# Patient Record
Sex: Male | Born: 1945 | Race: White | Hispanic: No | Marital: Married | Smoking: Never smoker
Health system: Southern US, Community
[De-identification: ages and names within clinical notes are randomized; demographics above are authoritative.]

## PROBLEM LIST (undated history)

## (undated) DIAGNOSIS — I2510 Atherosclerotic heart disease of native coronary artery without angina pectoris: Secondary | ICD-10-CM

## (undated) DIAGNOSIS — K5730 Diverticulosis of large intestine without perforation or abscess without bleeding: Secondary | ICD-10-CM

## (undated) DIAGNOSIS — R739 Hyperglycemia, unspecified: Secondary | ICD-10-CM

## (undated) DIAGNOSIS — Z125 Encounter for screening for malignant neoplasm of prostate: Secondary | ICD-10-CM

## (undated) DIAGNOSIS — Z860100 Personal history of colon polyps, unspecified: Secondary | ICD-10-CM

## (undated) DIAGNOSIS — M542 Cervicalgia: Secondary | ICD-10-CM

## (undated) DIAGNOSIS — M47812 Spondylosis without myelopathy or radiculopathy, cervical region: Secondary | ICD-10-CM

## (undated) DIAGNOSIS — E785 Hyperlipidemia, unspecified: Secondary | ICD-10-CM

## (undated) DIAGNOSIS — R253 Fasciculation: Secondary | ICD-10-CM

## (undated) DIAGNOSIS — R011 Cardiac murmur, unspecified: Secondary | ICD-10-CM

## (undated) DIAGNOSIS — T8859XA Other complications of anesthesia, initial encounter: Secondary | ICD-10-CM

## (undated) DIAGNOSIS — E782 Mixed hyperlipidemia: Secondary | ICD-10-CM

## (undated) DIAGNOSIS — T4145XA Adverse effect of unspecified anesthetic, initial encounter: Secondary | ICD-10-CM

## (undated) DIAGNOSIS — Z87442 Personal history of urinary calculi: Secondary | ICD-10-CM

## (undated) DIAGNOSIS — H269 Unspecified cataract: Secondary | ICD-10-CM

## (undated) DIAGNOSIS — K219 Gastro-esophageal reflux disease without esophagitis: Secondary | ICD-10-CM

## (undated) DIAGNOSIS — R319 Hematuria, unspecified: Secondary | ICD-10-CM

## (undated) DIAGNOSIS — M5090 Cervical disc disorder, unspecified, unspecified cervical region: Secondary | ICD-10-CM

## (undated) DIAGNOSIS — Z79899 Other long term (current) drug therapy: Secondary | ICD-10-CM

## (undated) DIAGNOSIS — I10 Essential (primary) hypertension: Secondary | ICD-10-CM

## (undated) DIAGNOSIS — I209 Angina pectoris, unspecified: Secondary | ICD-10-CM

## (undated) HISTORY — DX: Encounter for screening for malignant neoplasm of prostate: Z12.5

## (undated) HISTORY — DX: Cervicalgia: M54.2

## (undated) HISTORY — DX: Mixed hyperlipidemia: E78.2

## (undated) HISTORY — DX: Diverticulosis of large intestine without perforation or abscess without bleeding: K57.30

## (undated) HISTORY — PX: CATARACT EXTRACTION, BILATERAL: SHX1313

## (undated) HISTORY — DX: Gastro-esophageal reflux disease without esophagitis: K21.9

## (undated) HISTORY — DX: Hyperglycemia, unspecified: R73.9

## (undated) HISTORY — DX: Essential (primary) hypertension: I10

## (undated) HISTORY — PX: VASECTOMY: SHX75

## (undated) HISTORY — DX: Unspecified cataract: H26.9

## (undated) HISTORY — DX: Hyperlipidemia, unspecified: E78.5

## (undated) HISTORY — PX: UMBILICAL HERNIA REPAIR: SUR1181

## (undated) HISTORY — DX: Atherosclerotic heart disease of native coronary artery without angina pectoris: I25.10

## (undated) HISTORY — PX: INGUINAL HERNIA REPAIR: SUR1180

## (undated) HISTORY — PX: CARDIAC CATHETERIZATION: SHX172

## (undated) HISTORY — DX: Cervical disc disorder, unspecified, unspecified cervical region: M50.90

## (undated) HISTORY — DX: Personal history of colonic polyps: Z86.010

## (undated) HISTORY — PX: COLONOSCOPY: SHX174

## (undated) HISTORY — DX: Other long term (current) drug therapy: Z79.899

## (undated) HISTORY — DX: Angina pectoris, unspecified: I20.9

## (undated) HISTORY — DX: Spondylosis without myelopathy or radiculopathy, cervical region: M47.812

## (undated) HISTORY — PX: APPENDECTOMY: SHX54

## (undated) HISTORY — DX: Personal history of colon polyps, unspecified: Z86.0100

---

## 2017-05-13 ENCOUNTER — Encounter: Payer: Self-pay | Admitting: Gastroenterology

## 2017-07-03 ENCOUNTER — Encounter (INDEPENDENT_AMBULATORY_CARE_PROVIDER_SITE_OTHER): Payer: Self-pay

## 2017-07-03 ENCOUNTER — Encounter: Payer: Self-pay | Admitting: Gastroenterology

## 2017-07-03 ENCOUNTER — Ambulatory Visit (INDEPENDENT_AMBULATORY_CARE_PROVIDER_SITE_OTHER): Payer: Medicare Other | Admitting: Gastroenterology

## 2017-07-03 VITALS — BP 100/70 | HR 76 | Ht 71.0 in | Wt 217.0 lb

## 2017-07-03 DIAGNOSIS — Z1211 Encounter for screening for malignant neoplasm of colon: Secondary | ICD-10-CM

## 2017-07-03 DIAGNOSIS — Z7902 Long term (current) use of antithrombotics/antiplatelets: Secondary | ICD-10-CM | POA: Diagnosis not present

## 2017-07-03 NOTE — Progress Notes (Signed)
HPI :  71 y/o male with a history of CAD, GERD, HLD, here for a new patient visit to discuss having a colonoscopy.   Records show he had a colonoscopy on 05/2012 showing extensive diverticular disease and no polyps per pictures and what the patient states, but the written report is not available, we don't know the quality of the bowel prep.  He is taking aspirin and plavix. Colonoscopy done in [facility] at a surgical center by a Dr. Vernon Prey.  He does have a history of polyps in 2003. No polyp on the last 2 exams he endorses over the past 10 years. He was told he needed a colonoscopy every 5 years.  No blood in the stools. No diarrhea or constipation. He has a BM once every other day, takes stool softeners to keep stools soft. Certain foods can precipitate some loose stools. He has hemorrhoids which bother his rarely, not bothering him routinely. He has never had diverticulitis.   No FH of colon cancer. History of angioplasty. He has some angina which bothers him rarely. No dyspnea. He takes nitroglycerin rarely. HE denies much heartbirnm at present, using zantac PRN which works well. No dysphagia.   No tobacco use, no alcohol.  Echocardiogram done 09/2015 - EF 65-70%   Past Medical History:  Diagnosis Date  . Angina, class II ([place])   . Arthritis of neck ([place])   . CAD (coronary artery disease)   . Cervical disc disease   . Diverticula of colon   . Dyslipidemia   . Encounter for long-term (current) use of medications   . GERD without esophagitis   . History of colon polyps   . HTN (hypertension)   . Hyperglycemia   . Hyperlipemia, mixed   . Neck pain   . Special screening for malignant neoplasm of prostate      Past Surgical History:  Procedure Laterality Date  . APPENDECTOMY    . CARDIAC CATHETERIZATION    . CATARACT EXTRACTION, BILATERAL    . COLONOSCOPY    . INGUINAL HERNIA REPAIR    . UMBILICAL HERNIA REPAIR    . VASECTOMY     Family History  Problem Relation Age of Onset    . Breast cancer Mother   . Heart disease Brother    Social History  Substance Use Topics  . Smoking status: Never Smoker  . Smokeless tobacco: Never Used  . Alcohol use No   Current Outpatient Prescriptions  Medication Sig Dispense Refill  . Ascorbic Acid (VITAMIN C) 500 MG CAPS Take by mouth.    Marland Kitchen aspirin 81 MG chewable tablet Chew by mouth daily.    . cetirizine (ZYRTEC) 10 MG tablet Take 10 mg by mouth daily.    . clopidogrel (PLAVIX) 75 MG tablet Take 75 mg by mouth daily.    . Coenzyme Q10 (COQ10) 200 MG CAPS Take 1 capsule by mouth daily.    Marland Kitchen docusate sodium (COLACE) 100 MG capsule Take 200 mg by mouth at bedtime.    . isosorbide mononitrate (IMDUR) 60 MG 24 hr tablet Take 60 mg by mouth every morning.    Marland Kitchen lisinopril (PRINIVIL,ZESTRIL) 10 MG tablet Take 10 mg by mouth at bedtime.    . metoprolol succinate (TOPROL-XL) 100 MG 24 hr tablet Take 100 mg by mouth daily. Take with or immediately following a meal.    . nitroGLYCERIN (NITROSTAT) 0.4 MG SL tablet Place 0.4 mg under the tongue every 5 (five) minutes as needed for chest pain.    Marland Kitchen  Omega-3 Fatty Acids (FISH OIL) 1200 MG CAPS Take 1 capsule by mouth daily.    . ranitidine (ZANTAC) 150 MG tablet Take 150 mg by mouth at bedtime as needed for heartburn.    . rosuvastatin (CRESTOR) 10 MG tablet Take 10 mg by mouth at bedtime.    . thiamine (VITAMIN B-1) 100 MG tablet Take 100 mg by mouth daily.    . vitamin B-12 (CYANOCOBALAMIN) 500 MCG tablet Take 500 mcg by mouth daily.     No current facility-administered medications for this visit.    No Known Allergies   Review of Systems: All systems reviewed and negative except where noted in HPI.    No results found.  Physical Exam: BP 100/70   Pulse 76   Ht 5\' 11"  (1.803 m)   Wt 217 lb (98.4 kg)   BMI 30.27 kg/m  Constitutional: Pleasant,well-developed, male in no acute distress. HEENT: Normocephalic and atraumatic. Conjunctivae are normal. No scleral icterus. Neck  supple.  Cardiovascular: Normal rate, regular rhythm.  Pulmonary/chest: Effort normal and breath sounds normal. No wheezing, rales or rhonchi. Abdominal: Soft, nondistended, nontender. There are no masses palpable.  Extremities: no edema Lymphadenopathy: No cervical adenopathy noted. Neurological: Alert and oriented to person place and time. Skin: Skin is warm and dry. No rashes noted. Psychiatric: Normal mood and affect. Behavior is normal.   ASSESSMENT AND PLAN: 70 year old male with medical history as outlined above, seen for new patient visit to discuss having a screening colonoscopy. He had one small polyp removed in his first colonoscopy several years ago, he reports his last 2 colonoscopies he has had no polyps removed. His last exam was done 5 years ago showing diverticulosis and no polyps. He has no family history of colon cancer, he has no alarm symptoms or concerning bowel changes.  I reviewed with him national guidelines for colon cancer screening. If the bowel preparation for his last exam was adequate, he is not due for another colonoscopy for 10 years from his last exam (2023). I will reach out to his primary care to obtain his colonoscopy report as we don't have that available today, to clarify the quality of his bowel preparation. He reports he was told he needed a colonoscopy every 5 years, this is a change for him from prior recommendations by other physicians. I discussed that screening guidelines can change over time. If he is anxious about this issue I offered him another colonoscopy now, he wants to think about it, and we'll await the report of his last exam to clarify the bowel preparation quality. He agreed with the plan. When he does have his next colonoscopy, plavix will need to be held for 5 days.  Schoenchen Cellar, MD [facility] Pager [phone]

## 2017-07-03 NOTE — Patient Instructions (Signed)
We will send for the report of your 2013 colonoscopy and contact you when we receive for further advisement. Please call us in a week if you have not heard from [place].  If you are age 71 or older, your body mass index should be between 23-30. Your Body mass index is 30.27 kg/m. If this is out of the aforementioned range listed, please consider follow up with your Primary Care Provider.  If you are age 90 or younger, your body mass index should be between 19-25. Your Body mass index is 30.27 kg/m. If this is out of the aformentioned range listed, please consider follow up with your Primary Care Provider.   Thank you.

## 2017-08-14 ENCOUNTER — Telehealth: Payer: Self-pay | Admitting: Gastroenterology

## 2017-08-14 NOTE — Telephone Encounter (Signed)
Records obtained for prior colonoscopy, done at [facility] in [facility] by Dr. Mariana Arn  Colonoscopy 06/13/12 - extensive diverticulosis, normal colon otherwise no polyps - GOOD bowel preparation   Jan can you please let this patient know based on guidelines he does not warrant another screening colonoscopy until 05/2022. I know this is different from what his prior GI physician had told him, but he has not had a polyp on his last 2 colonoscopies. If he is anxious about this and wants it sooner, I offered him the exam per our last clinic visit for piece of mind, but otherwise I think okay to wait until 05/2022

## 2017-08-14 NOTE — Telephone Encounter (Signed)
Spoke to pt. Relayed findings and recommendations.  He may want to do colon sooner.  Very appreciative and complimentary of our practice.  Placed recall for 05-2022 according to Dr. Doyne Keel recommendations.

## 2018-01-14 ENCOUNTER — Other Ambulatory Visit: Payer: Self-pay | Admitting: Urology

## 2018-01-23 ENCOUNTER — Encounter (HOSPITAL_COMMUNITY): Payer: Self-pay

## 2018-01-23 NOTE — Patient Instructions (Addendum)
Your procedure is scheduled on: Thursday, January 30, 2018   Surgery Time:  4:15PM 4:45PM   Report to [facility]  Entrance    Report to admitting at 2:15 PM   Call this number if you have problems the morning of surgery [phone]   Do not eat food:After Midnight.   May have liquids until 8:00AM day of surgery      CLEAR LIQUID DIET   Foods Allowed                                                                     Foods Excluded  Coffee and tea, regular and decaf                             liquids that you cannot  Plain Jell-O in any flavor                                             see through such as: Fruit ices (not with fruit pulp)                                     milk, soups, orange juice  Iced Popsicles                                    All solid food Carbonated beverages, regular and diet                                    Cranberry, grape and apple juices Sports drinks like Gatorade Lightly seasoned clear broth or consume(fat free) Sugar, honey syrup  Sample Menu Breakfast                                Lunch                                     Supper Cranberry juice                    Beef broth                            Chicken broth Jell-O                                     Grape juice                           Apple juice Coffee or tea  Jell-O                                      Popsicle                                                Coffee or tea                        Coffee or tea   Do NOT smoke after Midnight   Take these medicines the morning of surgery with A SIP OF WATER: Cetirizine, Isosorbide, Metoprolol                               You may not have any metal on your body including jewelry, and body piercings             Do not wear lotions, powders, perfumes/cologne, or deodorant                          Men may shave face and neck.   Do not bring valuables to the hospital. [place].   Contacts, dentures or bridgework may not be worn into surgery.   Patients discharged the day of surgery will not be allowed to drive home.   Name and phone number of your driver:  Katharine Look (wife) [phone]   Special Instructions: Bring a copy of your healthcare power of attorney and living will documents         the day of surgery if you haven't scanned them in before.              Please read over the following fact sheets you were given:  [facility] - Preparing for Surgery Before surgery, you can play an important role.  Because skin is not sterile, your skin needs to be as free of germs as possible.  You can reduce the number of germs on your skin by washing with CHG (chlorahexidine gluconate) soap before surgery.  CHG is an antiseptic cleaner which kills germs and bonds with the skin to continue killing germs even after washing. Please DO NOT use if you have an allergy to CHG or antibacterial soaps.  If your skin becomes reddened/irritated stop using the CHG and inform your nurse when you arrive at Short Stay. Do not shave (including legs and underarms) for at least 48 hours prior to the first CHG shower.  You may shave your face/neck.  Please follow these instructions carefully:  1.  Shower with CHG Soap the night before surgery and the  morning of surgery.  2.  If you choose to wash your hair, wash your hair first as usual with your normal  shampoo.  3.  After you shampoo, rinse your hair and body thoroughly to remove the shampoo.                             4.  Use CHG as you would any other liquid soap.  You can apply chg directly to  the skin and wash.  Gently with a scrungie or clean washcloth.  5.  Apply the CHG Soap to your body ONLY FROM THE NECK DOWN.   Do   not use on face/ open                           Wound or open sores. Avoid contact with eyes, ears mouth and   genitals (private parts).                       Wash face,  Genitals  (private parts) with your normal soap.             6.  Wash thoroughly, paying special attention to the area where your    surgery  will be performed.  7.  Thoroughly rinse your body with warm water from the neck down.  8.  DO NOT shower/wash with your normal soap after using and rinsing off the CHG Soap.                9.  Pat yourself dry with a clean towel.            10.  Wear clean pajamas.            11.  Place clean sheets on your bed the night of your first shower and do not  sleep with pets. Day of Surgery : Do not apply any lotions/deodorants the morning of surgery.  Please wear clean clothes to the hospital/surgery center.  FAILURE TO FOLLOW THESE INSTRUCTIONS MAY RESULT IN THE CANCELLATION OF YOUR SURGERY  PATIENT SIGNATURE_________________________________  NURSE SIGNATURE__________________________________  ________________________________________________________________________

## 2018-01-24 ENCOUNTER — Encounter (HOSPITAL_COMMUNITY)
Admission: RE | Admit: 2018-01-24 | Discharge: 2018-01-24 | Disposition: A | Discharge status: Home / Self Care | Payer: Medicare Other | Source: Ambulatory Visit | Attending: Urology | Admitting: Urology

## 2018-01-24 ENCOUNTER — Encounter (HOSPITAL_COMMUNITY): Payer: Self-pay

## 2018-01-24 ENCOUNTER — Other Ambulatory Visit: Payer: Self-pay

## 2018-01-24 DIAGNOSIS — R9431 Abnormal electrocardiogram [ECG] [EKG]: Secondary | ICD-10-CM | POA: Diagnosis not present

## 2018-01-24 DIAGNOSIS — Z01812 Encounter for preprocedural laboratory examination: Secondary | ICD-10-CM | POA: Insufficient documentation

## 2018-01-24 DIAGNOSIS — N201 Calculus of ureter: Secondary | ICD-10-CM | POA: Insufficient documentation

## 2018-01-24 DIAGNOSIS — Z01810 Encounter for preprocedural cardiovascular examination: Secondary | ICD-10-CM | POA: Insufficient documentation

## 2018-01-24 HISTORY — DX: Fasciculation: R25.3

## 2018-01-24 HISTORY — DX: Other complications of anesthesia, initial encounter: T88.59XA

## 2018-01-24 HISTORY — DX: Hematuria, unspecified: R31.9

## 2018-01-24 HISTORY — DX: Cardiac murmur, unspecified: R01.1

## 2018-01-24 HISTORY — DX: Personal history of urinary calculi: Z87.442

## 2018-01-24 HISTORY — DX: Adverse effect of unspecified anesthetic, initial encounter: T41.45XA

## 2018-01-24 LAB — BASIC METABOLIC PANEL
ANION GAP: 5 (ref 5–15)
BUN: 20 mg/dL (ref 6–20)
CALCIUM: 9.3 mg/dL (ref 8.9–10.3)
CO2: 26 mmol/L (ref 22–32)
Chloride: 107 mmol/L (ref 101–111)
Creatinine, Ser: 0.78 mg/dL (ref 0.61–1.24)
GFR calc Af Amer: >60 mL/min (ref >60)
GFR calc non Af Amer: >60 mL/min (ref >60)
GLUCOSE: 99 mg/dL (ref 65–99)
Potassium: 4 mmol/L (ref 3.5–5.1)
Sodium: 138 mmol/L (ref 135–145)

## 2018-01-24 LAB — CBC
HEMATOCRIT: 44.6 % (ref 39.0–52.0)
HEMOGLOBIN: 15.1 g/dL (ref 13.0–17.0)
MCH: 31.1 pg (ref 26.0–34.0)
MCHC: 33.9 g/dL (ref 30.0–36.0)
MCV: 91.8 fL (ref 78.0–100.0)
Platelets: 162 10*3/uL (ref 150–400)
RBC: 4.86 MIL/uL (ref 4.22–5.81)
RDW: 12.8 % (ref 11.5–15.5)
WBC: 5.7 10*3/uL (ref 4.0–10.5)

## 2018-01-27 NOTE — Pre-Procedure Instructions (Signed)
Dr. Hatchett reviewed EKG no new orders received at this time. 

## 2018-01-29 NOTE — H&P (Signed)
Office Visit Report     01/03/2018     --------------------------------------------------------------------------------     Henry Morris   MRN: [id number]  PRIMARY CARE:     DOB: Sep 29, 1946, 72 year old Male  REFERRING:  Henry Morris   SSN: -**[id number]  PROVIDER:  Raynelle Bring, M.D.     LOCATION:  [facility] [phone]     --------------------------------------------------------------------------------     CC/HPI: Hematuria     Henry Morris returns today to complete his hematuria evaluation. He has denied any gross hematuria or any pain symptoms since his last visit. He follows up today to undergo CT imaging and cystoscopy.        ALLERGIES: None     MEDICATIONS: Aspirin   Lisinopril   Metoprolol Succinate   Cetirizine Hcl   Clopidogrel   Coq10   Docusate Sodium   Isosorbide   Ranitidine Hcl   Rosuvastatin Calcium   Vitamin C 500 mg tablet        GU PSH: Vasectomy       NON-GU PSH: Anesth, Catheterize Heart  Appendectomy (open)  Cataract surgery, Bilateral  Hernia Repair       GU PMH: Gross hematuria - 12/13/2017         PMH Notes:     1) Hematuria: He developed gross, painless hematuria in June 2018. He since has had multiple urinalyses demonstrating microscopic hematuria. He does take Plavix for CAD prevention. He denies tobacco use and has no family history of GU malignancy.      NON-GU PMH: Arthritis  GERD  Hypercholesterolemia  Hypertension       FAMILY HISTORY: 1 Daughter - Runs in Family  Breast Cancer - Mother  Myocardial Infarction - Mother     Notes: Father decreased from drowning     SOCIAL HISTORY: Marital Status: Married  Preferred Language: English; Ethnicity: Not Hispanic Or Latino; Race: White  Current Smoking Status: Patient has never smoked.     Tobacco Use Assessment Completed: Used Tobacco in last 30 days?  Does not drink anymore.   Drinks 1 caffeinated drink per day.       REVIEW OF SYSTEMS:      GU Review Male:   Patient denies frequent urination, hard to postpone urination, burning/ pain with urination, get up at night to urinate, leakage of urine, stream starts and stops, trouble starting your streams, and have to strain to urinate .   Gastrointestinal (Lower):   Patient denies diarrhea and constipation.   Gastrointestinal (Upper):   Patient denies nausea and vomiting.   Constitutional:   Patient denies fever, night sweats, weight loss, and fatigue.   Skin:   Patient denies skin rash/ lesion and itching.   Eyes:   Patient denies blurred vision and double vision.   Ears/ Nose/ Throat:   Patient denies sore throat and sinus problems.   Hematologic/Lymphatic:   Patient denies swollen glands and easy bruising.   Cardiovascular:   Patient denies leg swelling and chest pains.   Respiratory:   Patient denies cough and shortness of breath.   Endocrine:   Patient denies excessive thirst.   Musculoskeletal:   Patient denies back pain and joint pain.   Neurological:   Patient denies headaches and dizziness.   Psychologic:   Patient denies depression and anxiety.     VITAL SIGNS:       01/03/2018 02:38 PM   Weight 215 lb / 97.52 kg  Height 70 in / 177.8 cm   BP 127/78 mmHg   Heart Rate 76 /min   Temperature 98.2 F / 36.7 C   BMI 30.8 kg/m     GU PHYSICAL EXAMINATION:     Urethral Meatus: Normal size. No lesion, no wart, no discharge, no polyp. Normal location.     MULTI-SYSTEM PHYSICAL EXAMINATION:     Constitutional: Well-nourished. No physical deformities. Normally developed. Good grooming.   Gastrointestinal: No mass, no tenderness, no rigidity, non obese abdomen. No CVA tenderness.        PAST DATA REVIEWED:   Source Of History:  Patient   Urine Test Review:   Urinalysis   X-Ray Review: C.T. Abdomen/Pelvis: Reviewed Films.      Notes:                     Creatinine 0.7     I independently reviewed his CT scan. He does have multiple small nonobstructing right renal  calculi. There are noted to be large bilateral cysts without concern for malignancy. On the left side, he does have a distal 3-4 mm left UVJ calculus. No obvious bladder lesions.     PROCEDURES:          Flexible Cystoscopy - 52000   Indication: Hematuria  Risks, benefits, and potential complications of the procedure were discussed with the patient including infection, bleeding, voiding discomfort, urinary retention, fever, chills, sepsis, and others. All questions were answered. Informed consent was obtained. Sterile technique and intraurethral analgesia were used.   Meatus:  Normal size. Normal location. Normal condition.   Urethra:  No strictures.   External Sphincter:  Normal.   Verumontanum:  Normal.   Prostate:  Non-obstructing. No hyperplasia.   Bladder Neck:  Non-obstructing.   Ureteral Orifices:  Normal location. Normal size. Normal shape. Effluxed clear urine. There is some edema and noted at the left ureteral orifice consistent with his UVJ calculus.   Bladder:  No trabeculation. No tumors. Normal mucosa. No stones.         Chaperone: Eddie North  The procedure was well-tolerated and without complications. Instructions were given to call the office immediately if questions or problems.            C.T. Hematuria - 74178, [id number]         [address]              Urinalysis  Dipstick Dipstick Cont'd   Color: Yellow Bilirubin: Neg   Appearance: Clear Ketones: Neg   Specific Gravity: 1.025 Blood: Neg   pH: 6.0 Protein: Neg   Glucose: Neg Urobilinogen: 0.2     Nitrites: Neg     Leukocyte Esterase: Neg       ASSESSMENT:       ICD-10 Details   1 GU:   Ureteral calculus - N20.1    2   Gross hematuria - R31.0      PLAN:               Medications  New Meds: Hydrocodone-Acetaminophen 5 mg-325 mg tablet 1-2 tablet PO Q 6 H prn   #20  0 Refill(s)   Tamsulosin Hcl 0.4 mg capsule 1 capsule PO Q HS   #14  0 Refill(s)                Schedule  Return Visit/Planned  Activity: Other See Visit Notes              Note:  Will call to schedule surgery             Document  Letter(s):  Created for Patient: Clinical Summary            Notes:   1. Hematuria: We discussed potential causes for his hematuria which may include his stone disease most likely but also probably his BPH. However, I did reassure him that there are no concerning areas to suggest a malignant etiology.     2. Left ureteral calculus: Fortunately, he has not been symptomatic but I did inform him about his ureteral stone today. We reviewed options and will proceed with an initial course of medical expulsion therapy. He was provided a prescription for hydrocodone and tamsulosin today. He was also instructed on appropriate precautions and will call should he develop fever, uncontrolled pain, or uncontrolled nausea/vomiting.     It is difficult to know how long his stone has been present as he has been asymptomatic. We have agreed to tentatively plan for definitive treatment over the next few weeks unless he passes the stone. After reviewing options and considering his anti-platelet agent, we have discussed proceeding with left ureteroscopic stone removal with possible laser lithotripsy and possible stent placement. We reviewed the potential risks and complications of this procedure. He is informed consent. He will notify me if he passes the stone prior to his planned procedure.     Cc: Dr. Eber Hong        * Signed by Raynelle Bring, M.D. on 01/03/18 at 8:07 PM (EDT)*

## 2018-01-30 ENCOUNTER — Ambulatory Visit (HOSPITAL_COMMUNITY)
Admission: RE | Admit: 2018-01-30 | Discharge: 2018-01-30 | Disposition: A | Discharge status: Home / Self Care | Payer: Medicare Other | Source: Ambulatory Visit | Attending: Urology | Admitting: Urology

## 2018-01-30 ENCOUNTER — Ambulatory Visit (HOSPITAL_COMMUNITY): Payer: Medicare Other | Admitting: Certified Registered Nurse Anesthetist

## 2018-01-30 ENCOUNTER — Encounter (HOSPITAL_COMMUNITY): Payer: Self-pay

## 2018-01-30 ENCOUNTER — Telehealth (HOSPITAL_COMMUNITY): Payer: Self-pay | Admitting: *Deleted

## 2018-01-30 ENCOUNTER — Ambulatory Visit (HOSPITAL_COMMUNITY): Payer: Medicare Other

## 2018-01-30 ENCOUNTER — Encounter (HOSPITAL_COMMUNITY)
Admission: RE | Disposition: A | Discharge status: Home / Self Care | Payer: Self-pay | Source: Ambulatory Visit | Attending: Urology

## 2018-01-30 DIAGNOSIS — Z7902 Long term (current) use of antithrombotics/antiplatelets: Secondary | ICD-10-CM | POA: Insufficient documentation

## 2018-01-30 DIAGNOSIS — E785 Hyperlipidemia, unspecified: Secondary | ICD-10-CM | POA: Diagnosis not present

## 2018-01-30 DIAGNOSIS — N201 Calculus of ureter: Secondary | ICD-10-CM | POA: Insufficient documentation

## 2018-01-30 DIAGNOSIS — Z7982 Long term (current) use of aspirin: Secondary | ICD-10-CM | POA: Diagnosis not present

## 2018-01-30 DIAGNOSIS — I10 Essential (primary) hypertension: Secondary | ICD-10-CM | POA: Insufficient documentation

## 2018-01-30 DIAGNOSIS — K219 Gastro-esophageal reflux disease without esophagitis: Secondary | ICD-10-CM | POA: Diagnosis not present

## 2018-01-30 DIAGNOSIS — Z79899 Other long term (current) drug therapy: Secondary | ICD-10-CM | POA: Diagnosis not present

## 2018-01-30 HISTORY — PX: CYSTOSCOPY/URETEROSCOPY/HOLMIUM LASER/STENT PLACEMENT: SHX6546

## 2018-01-30 SURGERY — CYSTOSCOPY/URETEROSCOPY/HOLMIUM LASER/STENT PLACEMENT
Anesthesia: General | Site: Ureter | Laterality: Left

## 2018-01-30 MED ORDER — HYDROCODONE-ACETAMINOPHEN 5-325 MG PO TABS: 1.0000 | ORAL_TABLET | Freq: Four times a day (QID) | ORAL | 0 refills | Status: DC | PRN

## 2018-01-30 MED ORDER — SODIUM CHLORIDE 0.9 % IR SOLN
Status: DC | PRN
  Administered 2018-01-30: 3000 mL

## 2018-01-30 MED ORDER — ONDANSETRON HCL 4 MG/2ML IJ SOLN
INTRAMUSCULAR | Status: AC
  Filled 2018-01-30: qty 2

## 2018-01-30 MED ORDER — HYDROMORPHONE HCL 1 MG/ML IJ SOLN: 0.2500 mg | INTRAMUSCULAR | Status: DC | PRN

## 2018-01-30 MED ORDER — CEFAZOLIN SODIUM-DEXTROSE 2-4 GM/100ML-% IV SOLN
2.0000 g | Freq: Once | INTRAVENOUS | Status: AC
  Administered 2018-01-30: 2 g via INTRAVENOUS
  Filled 2018-01-30: qty 100

## 2018-01-30 MED ORDER — PROPOFOL 10 MG/ML IV BOLUS
INTRAVENOUS | Status: DC | PRN
  Administered 2018-01-30: 130 mg via INTRAVENOUS
  Administered 2018-01-30: 70 mg via INTRAVENOUS

## 2018-01-30 MED ORDER — LIDOCAINE 2% (20 MG/ML) 5 ML SYRINGE
INTRAMUSCULAR | Status: DC | PRN
  Administered 2018-01-30: 80 mg via INTRAVENOUS

## 2018-01-30 MED ORDER — FENTANYL CITRATE (PF) 100 MCG/2ML IJ SOLN
INTRAMUSCULAR | Status: DC | PRN
  Administered 2018-01-30 (×2): 50 ug via INTRAVENOUS

## 2018-01-30 MED ORDER — HYDROCODONE-ACETAMINOPHEN 7.5-325 MG PO TABS: 1.0000 | ORAL_TABLET | Freq: Once | ORAL | Status: DC | PRN

## 2018-01-30 MED ORDER — LIDOCAINE 2% (20 MG/ML) 5 ML SYRINGE
INTRAMUSCULAR | Status: AC
  Filled 2018-01-30: qty 5

## 2018-01-30 MED ORDER — ONDANSETRON HCL 4 MG/2ML IJ SOLN: 4.0000 mg | Freq: Once | INTRAMUSCULAR | Status: DC | PRN

## 2018-01-30 MED ORDER — ONDANSETRON HCL 4 MG/2ML IJ SOLN
INTRAMUSCULAR | Status: DC | PRN
  Administered 2018-01-30: 4 mg via INTRAVENOUS

## 2018-01-30 MED ORDER — MEPERIDINE HCL 50 MG/ML IJ SOLN: 6.2500 mg | INTRAMUSCULAR | Status: DC | PRN

## 2018-01-30 MED ORDER — DEXAMETHASONE SODIUM PHOSPHATE 10 MG/ML IJ SOLN
INTRAMUSCULAR | Status: AC
  Filled 2018-01-30: qty 1

## 2018-01-30 MED ORDER — PROPOFOL 10 MG/ML IV BOLUS
INTRAVENOUS | Status: AC
  Filled 2018-01-30: qty 20

## 2018-01-30 MED ORDER — DEXAMETHASONE SODIUM PHOSPHATE 10 MG/ML IJ SOLN
INTRAMUSCULAR | Status: DC | PRN
  Administered 2018-01-30: 10 mg via INTRAVENOUS

## 2018-01-30 MED ORDER — FENTANYL CITRATE (PF) 100 MCG/2ML IJ SOLN
INTRAMUSCULAR | Status: AC
  Filled 2018-01-30: qty 2

## 2018-01-30 MED ORDER — LACTATED RINGERS IV SOLN
INTRAVENOUS | Status: DC
  Administered 2018-01-30: 14:00:00 via INTRAVENOUS

## 2018-01-30 SURGICAL SUPPLY — 20 items
BAG URO CATCHER STRL LF (MISCELLANEOUS) ×3 IMPLANT
BASKET ZERO TIP NITINOL 2.4FR (BASKET) IMPLANT
CATH INTERMIT  6FR 70CM (CATHETERS) IMPLANT
CLOTH BEACON ORANGE TIMEOUT ST (SAFETY) ×3 IMPLANT
COVER FOOTSWITCH UNIV (MISCELLANEOUS) IMPLANT
COVER SURGICAL LIGHT HANDLE (MISCELLANEOUS) ×3 IMPLANT
FIBER LASER FLEXIVA 365 (UROLOGICAL SUPPLIES) ×3 IMPLANT
FIBER LASER TRAC TIP (UROLOGICAL SUPPLIES) IMPLANT
GLOVE BIOGEL M STRL SZ7.5 (GLOVE) ×3 IMPLANT
GOWN STRL REUS W/TWL LRG LVL3 (GOWN DISPOSABLE) ×6 IMPLANT
GUIDEWIRE ANG ZIPWIRE 038X150 (WIRE) IMPLANT
GUIDEWIRE STR DUAL SENSOR (WIRE) ×3 IMPLANT
IV NS 1000ML (IV SOLUTION) ×2
IV NS 1000ML BAXH (IV SOLUTION) ×1 IMPLANT
MANIFOLD NEPTUNE II (INSTRUMENTS) ×3 IMPLANT
PACK CYSTO (CUSTOM PROCEDURE TRAY) ×3 IMPLANT
SHEATH URETERAL 12FRX35CM (MISCELLANEOUS) IMPLANT
TUBING CONNECTING 10 (TUBING) ×2 IMPLANT
TUBING CONNECTING 10' (TUBING) ×1
TUBING UROLOGY SET (TUBING) ×3 IMPLANT

## 2018-01-30 NOTE — Interval H&P Note (Signed)
History and Physical Interval Note:  01/30/2018 2:22 PM  Henry Morris  has presented today for surgery, with the diagnosis of LEFT URETERAL STONE  The various methods of treatment have been discussed with the patient and family. After consideration of risks, benefits and other options for treatment, the patient has consented to  Procedure(s) with comments: CYSTOSCOPY/URETEROSCOPY/HOLMIUM LASER/STENT PLACEMENT (Left) - ONLY NEEDS 30 MIN FOR TOTAL PROCEDURE as a surgical intervention .  The patient's history has been reviewed, patient examined, no change in status, stable for surgery.  I have reviewed the patient's chart and labs.  Questions were answered to the patient's satisfaction.     Dereke Neumann,LES

## 2018-01-30 NOTE — Anesthesia Postprocedure Evaluation (Signed)
Anesthesia Post Note  Patient: Henry Morris  Procedure(s) Performed: CYSTOSCOPY/URETEROSCOPY/HOLMIUM LASER/STONE EXTRACTION (Left Ureter)     Patient location during evaluation: PACU Anesthesia Type: General Level of consciousness: awake and alert and oriented Pain management: pain level controlled Vital Signs Assessment: post-procedure vital signs reviewed and stable Respiratory status: spontaneous breathing, nonlabored ventilation and respiratory function stable Cardiovascular status: blood pressure returned to baseline and stable Postop Assessment: no apparent nausea or vomiting Anesthetic complications: no    Last Vitals:  Vitals:   01/30/18 1615 01/30/18 1630  BP: 120/80 125/81  Pulse: 73 73  Resp: 13 16  Temp:  36.9 C  SpO2: 92% 95%    Last Pain:  Vitals:   01/30/18 1630  TempSrc:   PainSc: 0-No pain                 Tayjon Halladay A.

## 2018-01-30 NOTE — Anesthesia Preprocedure Evaluation (Addendum)
Anesthesia Evaluation  Patient identified by MRN, date of birth, ID band Patient awake    Reviewed: Allergy & Precautions, NPO status , Patient's Chart, lab work & pertinent test results, reviewed documented beta blocker date and time   History of Anesthesia Complications (+) history of anesthetic complications  Airway Mallampati: II  TM Distance: >3 FB Neck ROM: Full    Dental no notable dental hx. (+) Teeth Intact, Caps   Pulmonary neg pulmonary ROS,    Pulmonary exam normal breath sounds clear to auscultation       Cardiovascular Exercise Tolerance: Good hypertension, Pt. on medications and Pt. on home beta blockers + angina with exertion and at rest + CAD  Normal cardiovascular exam+ Valvular Problems/Murmurs MVP  Rhythm:Regular Rate:Normal  Angioplasty 10 years ago Occasional angina  EKG 01/24/2018- NSR 1st degree AV Block, RBBB pattern, Old inferior wall MI, Poor R wave progression in V leads possible anterior wall MI age indeterminate   Neuro/Psych negative neurological ROS  negative psych ROS   GI/Hepatic Neg liver ROS, GERD  Medicated and Controlled,  Endo/Other  Hyperlipidemia  Renal/GU Left ureteral calculus  negative genitourinary   Musculoskeletal  (+) Arthritis , Osteoarthritis,    Abdominal   Peds  Hematology Plavix- last dose yesterday   Anesthesia Other Findings   Reproductive/Obstetrics                           Anesthesia Physical Anesthesia Plan  ASA: III  Anesthesia Plan: General   Post-op Pain Management:    Induction: Intravenous  PONV Risk Score and Plan: Ondansetron, Midazolam, Treatment may vary due to age or medical condition and Dexamethasone  Airway Management Planned: LMA  Additional Equipment:   Intra-op Plan:   Post-operative Plan: Extubation in OR  Informed Consent: I have reviewed the patients History and Physical, chart, labs and discussed  the procedure including the risks, benefits and alternatives for the proposed anesthesia with the patient or authorized representative who has indicated his/her understanding and acceptance.   Dental advisory given  Plan Discussed with: CRNA, Anesthesiologist and Surgeon  Anesthesia Plan Comments:         Anesthesia Quick Evaluation

## 2018-01-30 NOTE — Transfer of Care (Signed)
Immediate Anesthesia Transfer of Care Note  Patient: Henry Morris  Procedure(s) Performed: CYSTOSCOPY/URETEROSCOPY/HOLMIUM LASER/STONE EXTRACTION (Left Ureter)  Patient Location: PACU  Anesthesia Type:General  Level of Consciousness: awake, alert  and patient cooperative  Airway & Oxygen Therapy: Patient Spontanous Breathing and Patient connected to nasal cannula oxygen  Post-op Assessment: Report given to RN and Post -op Vital signs reviewed and stable  Post vital signs: Reviewed and stable  Last Vitals:  Vitals Value Taken Time  BP 126/79 01/30/2018  4:00 PM  Temp    Pulse 76 01/30/2018  4:00 PM  Resp 14 01/30/2018  4:00 PM  SpO2 99 % 01/30/2018  4:00 PM  Vitals shown include unvalidated device data.  Last Pain:  Vitals:   01/30/18 1357  TempSrc:   PainSc: 0-No pain         Complications: No apparent anesthesia complications

## 2018-01-30 NOTE — Op Note (Addendum)
Preoperative diagnosis: Left ureteral calculus  Postoperative diagnosis: Left ureteral calculus  Procedure:  1. Cystoscopy 2. Left ureteroscopy and stone removal 3. Ureteroscopic laser lithotripsy  Surgeon: Pryor Curia. M.D.  Anesthesia: General  Complications: None  EBL: Minimal  Specimens: 1. Left ureteral calculus  Disposition of specimens: [facility] for stone analysis  Indication: Henry Morris is a 72 y.o. year old patient with urolithiasis. After reviewing the management options for treatment, the patient elected to proceed with the above surgical procedure(s). We have discussed the potential benefits and risks of the procedure, side effects of the proposed treatment, the likelihood of the patient achieving the goals of the procedure, and any potential problems that might occur during the procedure or recuperation. Informed consent has been obtained.  Description of procedure:  The patient was taken to the operating room and general anesthesia was induced.  The patient was placed in the dorsal lithotomy position, prepped and draped in the usual sterile fashion, and preoperative antibiotics were administered. A preoperative time-out was performed.   Cystourethroscopy was performed.  The patient's urethra was examined and was normal. The bladder was then systematically examined in its entirety. There was no evidence for any bladder tumors, stones, or other mucosal pathology.    Attention then turned to the left ureteral orifice.  The left ureteral stone was visible just within the ureteral orifice.  A 0.38 sensor guidewire was then advanced up the left ureter into the renal pelvis under fluoroscopic guidance. The 6 Fr semirigid ureteroscope was then advanced into the ureter next to the guidewire and the calculus was identified.   The stone was then fragmented with the 365 micron holmium laser fiber on a setting of 0.6 J and frequency of 6 Hz.    All stones were then removed from the ureter with a zero tip nitinol basket.  Reinspection of the ureter revealed no remaining visible stones or fragments.   His procedure was very uncomplicated and it was felt that a stent was not needed.  The wire was removed.  The bladder was then emptied and the procedure ended.  The patient appeared to tolerate the procedure well and without complications.  The patient was able to be awakened and transferred to the recovery unit in satisfactory condition.

## 2018-01-30 NOTE — Discharge Instructions (Signed)
1. You may see some blood in the urine and may have some burning with urination for 48-72 hours. You also may notice that you have to urinate more frequently or urgently after your procedure which is normal.  °2. You should call should you develop an inability urinate, fever > 101, persistent nausea and vomiting that prevents you from eating or drinking to stay hydrated.  °

## 2018-01-30 NOTE — Anesthesia Procedure Notes (Signed)
Procedure Name: LMA Insertion Date/Time: 01/30/2018 3:16 PM Performed by: Montel Clock, CRNA Pre-anesthesia Checklist: Patient identified, Emergency Drugs available, Suction available, Patient being monitored and Timeout performed Patient Re-evaluated:Patient Re-evaluated prior to induction Oxygen Delivery Method: Circle system utilized Preoxygenation: Pre-oxygenation with 100% oxygen Induction Type: IV induction Ventilation: Mask ventilation without difficulty LMA: LMA inserted LMA Size: 5.0 Number of attempts: 2 Dental Injury: Teeth and Oropharynx as per pre-operative assessment  Comments: LMA 4 gastric port unable to seal,  LMA 5 good seal.

## 2018-01-31 ENCOUNTER — Other Ambulatory Visit: Payer: Self-pay

## 2018-01-31 ENCOUNTER — Emergency Department (HOSPITAL_COMMUNITY)
Admission: EM | Admit: 2018-01-31 | Discharge: 2018-01-31 | Disposition: A | Discharge status: Home / Self Care | Payer: Medicare Other | Attending: Emergency Medicine | Admitting: Emergency Medicine

## 2018-01-31 ENCOUNTER — Encounter (HOSPITAL_COMMUNITY): Payer: Self-pay

## 2018-01-31 ENCOUNTER — Emergency Department (HOSPITAL_COMMUNITY): Payer: Medicare Other

## 2018-01-31 DIAGNOSIS — R112 Nausea with vomiting, unspecified: Secondary | ICD-10-CM | POA: Insufficient documentation

## 2018-01-31 DIAGNOSIS — I2510 Atherosclerotic heart disease of native coronary artery without angina pectoris: Secondary | ICD-10-CM | POA: Insufficient documentation

## 2018-01-31 DIAGNOSIS — R1032 Left lower quadrant pain: Secondary | ICD-10-CM | POA: Diagnosis present

## 2018-01-31 DIAGNOSIS — R310 Gross hematuria: Secondary | ICD-10-CM | POA: Diagnosis not present

## 2018-01-31 DIAGNOSIS — Z7982 Long term (current) use of aspirin: Secondary | ICD-10-CM | POA: Diagnosis not present

## 2018-01-31 DIAGNOSIS — Z7901 Long term (current) use of anticoagulants: Secondary | ICD-10-CM | POA: Insufficient documentation

## 2018-01-31 DIAGNOSIS — G8918 Other acute postprocedural pain: Secondary | ICD-10-CM | POA: Insufficient documentation

## 2018-01-31 DIAGNOSIS — Z79899 Other long term (current) drug therapy: Secondary | ICD-10-CM | POA: Diagnosis not present

## 2018-01-31 LAB — COMPREHENSIVE METABOLIC PANEL
ALT: 19 U/L (ref 17–63)
AST: 27 U/L (ref 15–41)
Albumin: 4.3 g/dL (ref 3.5–5.0)
Alkaline Phosphatase: 47 U/L (ref 38–126)
Anion gap: 15 (ref 5–15)
BILIRUBIN TOTAL: 0.9 mg/dL (ref 0.3–1.2)
BUN: 22 mg/dL — AB (ref 6–20)
CO2: 18 mmol/L — ABNORMAL LOW (ref 22–32)
Calcium: 9.3 mg/dL (ref 8.9–10.3)
Chloride: 104 mmol/L (ref 101–111)
Creatinine, Ser: 1.25 mg/dL — ABNORMAL HIGH (ref 0.61–1.24)
GFR calc Af Amer: >60 mL/min (ref >60)
GFR, EST NON AFRICAN AMERICAN: 56 mL/min — AB (ref >60)
Glucose, Bld: 153 mg/dL — ABNORMAL HIGH (ref 65–99)
POTASSIUM: 4 mmol/L (ref 3.5–5.1)
Sodium: 137 mmol/L (ref 135–145)
TOTAL PROTEIN: 7.3 g/dL (ref 6.5–8.1)

## 2018-01-31 LAB — LIPASE, BLOOD: Lipase: 27 U/L (ref 11–51)

## 2018-01-31 LAB — URINALYSIS, MICROSCOPIC (REFLEX)
BACTERIA UA: NONE SEEN
SQUAMOUS EPITHELIAL / LPF: NONE SEEN

## 2018-01-31 LAB — CBC
HEMATOCRIT: 42.9 % (ref 39.0–52.0)
Hemoglobin: 15.1 g/dL (ref 13.0–17.0)
MCH: 31.5 pg (ref 26.0–34.0)
MCHC: 35.2 g/dL (ref 30.0–36.0)
MCV: 89.6 fL (ref 78.0–100.0)
Platelets: 165 10*3/uL (ref 150–400)
RBC: 4.79 MIL/uL (ref 4.22–5.81)
RDW: 12.7 % (ref 11.5–15.5)
WBC: 12 10*3/uL — AB (ref 4.0–10.5)

## 2018-01-31 LAB — URINALYSIS, ROUTINE W REFLEX MICROSCOPIC

## 2018-01-31 MED ORDER — SODIUM CHLORIDE 0.9 % IV BOLUS
1000.0000 mL | Freq: Once | INTRAVENOUS | Status: AC
  Administered 2018-01-31: 1000 mL via INTRAVENOUS

## 2018-01-31 MED ORDER — KETOROLAC TROMETHAMINE 30 MG/ML IJ SOLN
15.0000 mg | Freq: Once | INTRAMUSCULAR | Status: AC
  Administered 2018-01-31: 15 mg via INTRAVENOUS
  Filled 2018-01-31: qty 1

## 2018-01-31 MED ORDER — FENTANYL CITRATE (PF) 100 MCG/2ML IJ SOLN
50.0000 ug | Freq: Once | INTRAMUSCULAR | Status: AC
  Administered 2018-01-31: 50 ug via INTRAVENOUS
  Filled 2018-01-31: qty 2

## 2018-01-31 MED ORDER — ONDANSETRON 4 MG PO TBDP: 4.0000 mg | ORAL_TABLET | Freq: Three times a day (TID) | ORAL | 0 refills | Status: DC | PRN

## 2018-01-31 MED ORDER — ONDANSETRON HCL 4 MG/2ML IJ SOLN
4.0000 mg | Freq: Once | INTRAMUSCULAR | Status: AC
  Administered 2018-01-31: 4 mg via INTRAVENOUS
  Filled 2018-01-31: qty 2

## 2018-01-31 MED ORDER — ONDANSETRON HCL 4 MG/2ML IJ SOLN
4.0000 mg | Freq: Once | INTRAMUSCULAR | Status: AC | PRN
  Administered 2018-01-31: 4 mg via INTRAVENOUS

## 2018-01-31 MED ORDER — FENTANYL CITRATE (PF) 100 MCG/2ML IJ SOLN
50.0000 ug | Freq: Once | INTRAMUSCULAR | Status: DC
Start: 2018-01-31 — End: 2018-01-31

## 2018-01-31 MED ORDER — CEPHALEXIN 500 MG PO CAPS: 500.0000 mg | ORAL_CAPSULE | Freq: Three times a day (TID) | ORAL | 0 refills | Status: DC

## 2018-01-31 MED ORDER — SODIUM CHLORIDE 0.9 % IV BOLUS
500.0000 mL | Freq: Once | INTRAVENOUS | Status: AC
  Administered 2018-01-31: 500 mL via INTRAVENOUS

## 2018-01-31 MED ORDER — HYDROMORPHONE HCL 1 MG/ML IJ SOLN
0.5000 mg | Freq: Once | INTRAMUSCULAR | Status: AC
  Administered 2018-01-31: 0.5 mg via INTRAVENOUS
  Filled 2018-01-31: qty 1

## 2018-01-31 MED ORDER — SODIUM CHLORIDE 0.9 % IV SOLN
1.0000 g | Freq: Once | INTRAVENOUS | Status: AC
  Administered 2018-01-31: 1 g via INTRAVENOUS
  Filled 2018-01-31: qty 10

## 2018-01-31 NOTE — Discharge Instructions (Addendum)
Follow-up with Dr. Alinda Money.  Take the pain medication as prescribed as well as antibiotics.  Return to the ED if you develop worsening pain, fever, vomiting or other concerns.

## 2018-01-31 NOTE — ED Triage Notes (Signed)
Pt reports that he had surgery for a kidney stone earlier today and has been vomiting and experiencing hematuria since leaving the hospital. He states that he has been unable to take any of his pain medication dute to the vomiting. Complaining of 9/10 pain. A&Ox4. Ambulatory. Tense and fidgeting in triage.

## 2018-01-31 NOTE — ED Provider Notes (Signed)
[facility] DEPT Provider Note   CSN: [id number] Arrival date & time: 01/31/18  0051     History   Chief Complaint Chief Complaint  Patient presents with  . Post-op Problem  . Hematuria    HPI Henry Morris is a 72 y.o. male.  Patient from home with left lower abdominal pain and flank pain with nausea and vomiting.  States he underwent a cystoscopy today by Dr. Alinda Money for microscopic hematuria and was found to have a kidney stone.  He did not have any of his pain until he went home.  He had 4-5 episodes of nonbloody nonbilious emesis.  Denies any fever.  His urine is frank blood which patient states he was told by his doctor and was normal.  He does take Plavix which was not held for his procedure.  He denies any chest pain or shortness of breath.  He denies any diarrhea.  Denies any testicular pain.  He was not sent home with a catheter.  He is never had this kind of pain before.  The history is provided by the patient and the spouse.  Hematuria  Associated symptoms include abdominal pain. Pertinent negatives include no chest pain, no headaches and no shortness of breath.    Past Medical History:  Diagnosis Date  . Angina, class II ([place])   . Arthritis of neck   . Benign fasciculations   . Blood in urine   . CAD (coronary artery disease)   . Cervical disc disease   . Complication of anesthesia    blood pressure may drop after anesthesia given (colonoscopy)  . Diverticula of colon   . Dyslipidemia   . Encounter for long-term (current) use of medications   . GERD without esophagitis   . Heart murmur   . History of colon polyps   . History of kidney stones   . HTN (hypertension)   . Hyperglycemia   . Hyperlipemia, mixed   . Neck pain   . Special screening for malignant neoplasm of prostate     There are no active problems to display for this patient.   Past Surgical History:  Procedure Laterality Date  . APPENDECTOMY    . CARDIAC  CATHETERIZATION    . CATARACT EXTRACTION, BILATERAL    . COLONOSCOPY    . INGUINAL HERNIA REPAIR    . UMBILICAL HERNIA REPAIR    . VASECTOMY          Home Medications    Prior to Admission medications   Medication Sig Start Date End Date Taking? Authorizing Provider  cetirizine (ZYRTEC) 10 MG tablet Take 10 mg by mouth daily.   Yes [provider]  clopidogrel (PLAVIX) 75 MG tablet Take 75 mg by mouth daily.   Yes [provider]  docusate sodium (COLACE) 100 MG capsule Take 200 mg by mouth at bedtime.   Yes [provider]  HYDROcodone-acetaminophen (NORCO/VICODIN) 5-325 MG tablet Take 1-2 tablets by mouth every 6 (six) hours as needed. Patient taking differently: Take 1-2 tablets by mouth every 6 (six) hours as needed for moderate pain.  01/30/18  Yes Raynelle Bring, MD  isosorbide mononitrate (IMDUR) 60 MG 24 hr tablet Take 60 mg by mouth every morning.   Yes [provider]  lisinopril (PRINIVIL,ZESTRIL) 10 MG tablet Take 10 mg by mouth daily.    Yes [provider]  metoprolol succinate (TOPROL-XL) 100 MG 24 hr tablet Take 100 mg by mouth daily. Take with or immediately  following a meal.   Yes [provider]  nitroGLYCERIN (NITROSTAT) 0.4 MG SL tablet Place 0.4 mg under the tongue every 5 (five) minutes as needed for chest pain.   Yes [provider]  ranitidine (ZANTAC) 150 MG tablet Take 150 mg by mouth at bedtime.    Yes [provider]  rosuvastatin (CRESTOR) 10 MG tablet Take 10 mg by mouth at bedtime.   Yes [provider]  aspirin 81 MG chewable tablet Chew 81 mg by mouth daily.     [provider]  Coenzyme Q10 (COQ10) 200 MG CAPS Take 200 mg by mouth daily.     [provider]  Omega-3 Fatty Acids (FISH OIL) 1200 MG CAPS Take 2,400 mg by mouth daily.     [provider]  thiamine (VITAMIN B-1) 100 MG tablet Take 100 mg by mouth daily.    [provider]    vitamin B-12 (CYANOCOBALAMIN) 500 MCG tablet Take 500 mcg by mouth daily.    [provider]  vitamin C (ASCORBIC ACID) 500 MG tablet Take 500 mg by mouth daily.     [provider]    Family History Family History  Problem Relation Age of Onset  . Breast cancer Mother   . Heart disease Brother     Social History Social History   Tobacco Use  . Smoking status: Never Smoker  . Smokeless tobacco: Never Used  Substance Use Topics  . Alcohol use: No  . Drug use: No     Allergies   Patient has no known allergies.   Review of Systems Review of Systems  Constitutional: Positive for activity change and appetite change. Negative for fever.  HENT: Negative for congestion and rhinorrhea.   Respiratory: Negative for cough, chest tightness and shortness of breath.   Cardiovascular: Negative for chest pain and leg swelling.  Gastrointestinal: Positive for abdominal pain, nausea and vomiting.  Genitourinary: Positive for dysuria and hematuria. Negative for testicular pain.  Musculoskeletal: Positive for back pain. Negative for arthralgias and myalgias.  Skin: Negative for rash.  Neurological: Negative for dizziness, weakness and headaches.   all other systems are negative except as noted in the HPI and PMH.     Physical Exam Updated Vital Signs BP (!) 142/90   Pulse (!) 103   Temp 98.6 F (37 C) (Oral)   Resp 17   Ht 5\' 11"  (1.803 m)   Wt 96.2 kg (212 lb)   SpO2 (!) 76%   BMI 29.57 kg/m   Physical Exam  Constitutional: He is oriented to person, place, and time. He appears well-developed and well-nourished. No distress.  Uncomfortable, actively vomiting  HENT:  Head: Normocephalic and atraumatic.  Mouth/Throat: Oropharynx is clear and moist. No oropharyngeal exudate.  Eyes: Pupils are equal, round, and reactive to light. Conjunctivae and EOM are normal.  Neck: Normal range of motion. Neck supple.  No meningismus.  Cardiovascular: Normal rate,  regular rhythm, normal heart sounds and intact distal pulses.  No murmur heard. Pulmonary/Chest: Effort normal and breath sounds normal. No respiratory distress.  Abdominal: Soft. There is tenderness. There is no rebound and no guarding.  Left lower quadrant tenderness with voluntary guarding  Genitourinary:  Genitourinary Comments: No testicular tenderness  Musculoskeletal: Normal range of motion. He exhibits no edema or tenderness.  Neurological: He is alert and oriented to person, place, and time. No cranial nerve deficit. He exhibits normal muscle tone. Coordination normal.  No ataxia on finger to nose  bilaterally. No pronator drift. 5/5 strength throughout. CN 2-12 intact.Equal grip strength. Sensation intact.   Skin: Skin is warm.  Psychiatric: He has a normal mood and affect. His behavior is normal.  Nursing note and vitals reviewed.    ED Treatments / Results  Labs (all labs ordered are listed, but only abnormal results are displayed) Labs Reviewed  COMPREHENSIVE METABOLIC PANEL - Abnormal; Notable for the following components:      Result Value   CO2 18 (*)    Glucose, Bld 153 (*)    BUN 22 (*)    Creatinine, Ser 1.25 (*)    GFR calc non Af Amer 56 (*)    All other components within normal limits  CBC - Abnormal; Notable for the following components:   WBC 12.0 (*)    All other components within normal limits  URINALYSIS, ROUTINE W REFLEX MICROSCOPIC - Abnormal; Notable for the following components:   Color, Urine RED (*)    APPearance TURBID (*)    Glucose, UA   (*)    Value: TEST NOT REPORTED DUE TO COLOR INTERFERENCE OF URINE PIGMENT   Hgb urine dipstick   (*)    Value: TEST NOT REPORTED DUE TO COLOR INTERFERENCE OF URINE PIGMENT   Bilirubin Urine   (*)    Value: TEST NOT REPORTED DUE TO COLOR INTERFERENCE OF URINE PIGMENT   Ketones, ur   (*)    Value: TEST NOT REPORTED DUE TO COLOR INTERFERENCE OF URINE PIGMENT   Protein, ur   (*)    Value: TEST NOT REPORTED  DUE TO COLOR INTERFERENCE OF URINE PIGMENT   Nitrite   (*)    Value: TEST NOT REPORTED DUE TO COLOR INTERFERENCE OF URINE PIGMENT   Leukocytes, UA   (*)    Value: TEST NOT REPORTED DUE TO COLOR INTERFERENCE OF URINE PIGMENT   All other components within normal limits  URINE CULTURE  LIPASE, BLOOD  URINALYSIS, MICROSCOPIC (REFLEX)    EKG None  Radiology Dg C-arm 1-60 Min-no Report  Result Date: 01/30/2018 Fluoroscopy was utilized by the requesting physician.  No radiographic interpretation.   Ct Renal Stone Study  Result Date: 01/31/2018 CLINICAL DATA:  Nausea, vomiting and gross hematuria status post cystoscopy and ureteroscopy with holmium laser and stone extraction on 01/30/2018. EXAM: CT ABDOMEN AND PELVIS WITHOUT CONTRAST TECHNIQUE: Multidetector CT imaging of the abdomen and pelvis was performed following the standard protocol without IV contrast. COMPARISON:  None. FINDINGS: Lower chest: Normal size heart. Left main and three-vessel coronary arteriosclerosis. No pericardial effusion. Minimal bronchiectasis to the lower lobes. Subpleural bibasilar areas of atelectasis, fibrosis and/or scarring. Small hiatal hernia. Hepatobiliary: No focal liver abnormality is seen. No gallstones, gallbladder wall thickening, or biliary dilatation. Pancreas: Unremarkable. No pancreatic ductal dilatation or surrounding inflammatory changes. Spleen: Normal in size without focal abnormality. Adrenals/Urinary Tract: Normal bilateral adrenal glands. Perinephric fat stranding is seen about both kidneys. There are bilateral nonobstructing renal calculi the largest is in the interpolar right kidney measuring 6 mm. Cystic renal lesions are noted bilaterally some which appear simple and others indeterminate given lack of IV contrast. A 10.6 cm cyst in the lower pole right kidney is identified with smaller interpolar renal cysts present. The largest cyst the left kidney is anterior off the interpolar aspect measuring  7.4 cm. Tracking of fluid is seen along the left proximal to mid ureter suggesting rupture renal calyx urine extravasation. Dependent hyperdensities are seen within the bladder consistent with prior instrumentation.  Blood clots are fragmented calculi could potentially have this appearance. Air-fluid level along the nondependent aspect of the bladder be in keeping recent instrumentation. Stomach/Bowel: Stomach is within normal limits. Scattered colonic diverticulosis without acute diverticulitis. Appendix appears normal. No evidence of bowel wall thickening, distention, or inflammatory changes. Vascular/Lymphatic: Moderate aorto iliac atherosclerosis without aneurysm. No lymphadenopathy. Reproductive: Normal size prostate. Other: Fat containing left inguinal hernia. Musculoskeletal: Degenerative disc disease L5-S1 with multilevel degenerative facet arthropathy from L3 through S1. No suspicious osseous lesions. IMPRESSION: 1. Coronary arteriosclerosis. 2. Tracking of small volume of fluid along the left ureter consistent with ruptured left renal calyx. No obstructive uropathy. 3. Bilateral nonobstructing renal calculi, simple as well as complex renal cysts. Further evaluation limited by lack of IV contrast. 4. Dependent densities within the bladder consistent with recent stone extraction and instrumentation. Small blood clots or calculi my account for some of the layering hyperdensity seen along the dependent wall of the bladder. Air-fluid level consistent recent instrumentation within bladder. Electronically Signed   By: Ashley Royalty M.D.   On: 01/31/2018 03:51    Procedures Procedures (including critical care time)  Medications Ordered in ED Medications  sodium chloride 0.9 % bolus 1,000 mL (1,000 mLs Intravenous New Bag/Given 01/31/18 0220)  ondansetron (ZOFRAN) injection 4 mg (4 mg Intravenous Given 01/31/18 0202)  fentaNYL (SUBLIMAZE) injection 50 mcg (50 mcg Intravenous Given 01/31/18 0219)     Initial  Impression / Assessment and Plan / ED Course  I have reviewed the triage vital signs and the nursing notes.  Pertinent labs & imaging results that were available during my care of the patient were reviewed by me and considered in my medical decision making (see chart for details).    Left-sided abdominal pain with nausea and vomiting after cystoscopy today.  Also with gross hematuria.  Patient was given IV fluids, symptom control, check labs.   Bladder scan 16 mL.  Patient's pain is improved after medications in the ED.  His urine is frankly bloody and UA is nondiagnostic.  CT shows fluid around left ureter consistent with ruptured renal calyx.  Urine culture sent. Empiric rocephin given.  CT results discussed with Dr. Junious Silk of urology.  He feels these findings are likely expected postoperatively. operative note talks about stent placement but this apparently was not done. Question use of template. Dr. Junious Silk feels pain may be due to spasm from procedure as well as hematuria.  Dr. Junious Silk recommends hydration and pain control.  Patient is voiding and does not need a catheter. he feels that the symptoms should resolve on their own. Agrees with empiric antibiotics.  Pain improving in the ED. No vomiting. Results dw patient and wife. They are comfortable going home. Decline observation admission. HR improved. Tolerating PO.  Advised to not take pain meds on empty stomach. Will add zofran and keflex. Return precautions d/w patient including worsening pain, fever, uncontrolled vomiting or any other concerns. Followup with Dr. Alinda Money.  BP 134/83   Pulse 100   Temp 98.6 F (37 C) (Oral)   Resp 14   Ht 5\' 11"  (1.803 m)   Wt 96.2 kg (212 lb)   SpO2 94%   BMI 29.57 kg/m   Final Clinical Impressions(s) / ED Diagnoses   Final diagnoses:  Post-operative pain  Gross hematuria    ED Discharge Orders    None       Tillie Viverette, Annie Main, MD 01/31/18 [phone]

## 2018-01-31 NOTE — ED Notes (Signed)
Patient was given water and is not have any difficulty with it at this point.

## 2018-02-01 LAB — URINE CULTURE: Culture: NO GROWTH

## 2018-12-11 ENCOUNTER — Telehealth: Payer: Self-pay | Admitting: Gastroenterology

## 2018-12-11 NOTE — Telephone Encounter (Signed)
Ov scheduled on 3/24 at 3:00pm.

## 2018-12-11 NOTE — Telephone Encounter (Signed)
Pt called looking to schedule a colon but he is not due until 2023, pt is not having issues but stated that he is not comfortable waiting 10 yrs to have a colonoscopy, he said that he spoke with Dr. Havery Moros about this and Dr. Havery Moros said that it was ok to have procedure in 7 yrs instead. His last one was in 05/2012. Pls advise on scheduling. Thank you.

## 2018-12-11 NOTE — Telephone Encounter (Signed)
Patient will need OV he is on Plavix.  Please schedule with Dr Havery Moros

## 2019-01-13 ENCOUNTER — Ambulatory Visit: Payer: Medicare Other | Admitting: Gastroenterology

## 2019-02-10 NOTE — Progress Notes (Signed)
Spoke to pt and prescreened for 4-22 appt

## 2019-02-11 ENCOUNTER — Other Ambulatory Visit: Payer: Self-pay

## 2019-02-11 ENCOUNTER — Encounter: Payer: Self-pay | Admitting: Gastroenterology

## 2019-02-11 ENCOUNTER — Ambulatory Visit (INDEPENDENT_AMBULATORY_CARE_PROVIDER_SITE_OTHER): Payer: Medicare Other | Admitting: Gastroenterology

## 2019-02-11 VITALS — Ht 71.0 in | Wt 215.0 lb

## 2019-02-11 DIAGNOSIS — Z7902 Long term (current) use of antithrombotics/antiplatelets: Secondary | ICD-10-CM | POA: Diagnosis not present

## 2019-02-11 DIAGNOSIS — Z1211 Encounter for screening for malignant neoplasm of colon: Secondary | ICD-10-CM | POA: Diagnosis not present

## 2019-02-11 NOTE — Progress Notes (Addendum)
THIS ENCOUNTER IS A VIRTUAL VISIT DUE TO COVID-19 - PATIENT WAS NOT SEEN IN THE OFFICE. PATIENT HAS CONSENTED TO VIRTUAL VISIT / TELEMEDICINE VISIT. PATIENT DID NOT HAVE ACCESS TO VISUAL CAPABILITY, ONLY AUDIO WAS USED VIA PHONE   Location of patient: home Location of provider: office Persons participating: myself, patient Total time spent on call 15 minutes  HPI :  73 y/o male here for a follow up visit to discuss colon cancer screening.  Records obtained for prior colonoscopy, done at [facility] in [facility] by Dr. Mariana Arn. Colonoscopy 06/13/12 - extensive diverticulosis, normal colon otherwise no polyps - GOOD bowel preparation. He does have a history of polyps in 2003. No polyps on the last 2 exams over the past 10 years. He was told he needed a colonoscopy every 5 years.    No bowel symptoms, no diarrhea or constipation. No blood in the stools. No abdominal pains.  No cardiopulumonary symptoms. NO FH of colon cancer.  Colonoscopy 06/13/12 - extensive diverticulosis, normal colon otherwise no polyps - GOOD bowel preparation  He was seen by his cardiologist in December 2019, no new changes were made. Last echo shows EF of 65% in 09/2015.   Echocardiogram done 09/2015 - EF 65-70%   Past Medical History:  Diagnosis Date  . Angina, class II ([place])   . Arthritis of neck   . Benign fasciculations   . Blood in urine   . CAD (coronary artery disease)   . Cervical disc disease   . Complication of anesthesia    blood pressure may drop after anesthesia given (colonoscopy)  . Diverticula of colon   . Dyslipidemia   . Encounter for long-term (current) use of medications   . GERD without esophagitis   . Heart murmur   . History of colon polyps   . History of kidney stones   . HTN (hypertension)   . Hyperglycemia   . Hyperlipemia, mixed   . Neck pain   . Special screening for malignant neoplasm of prostate      Past Surgical History:  Procedure Laterality  Date  . APPENDECTOMY    . CARDIAC CATHETERIZATION    . CATARACT EXTRACTION, BILATERAL    . COLONOSCOPY    . CYSTOSCOPY/URETEROSCOPY/HOLMIUM LASER/STENT PLACEMENT Left 01/30/2018   Procedure: CYSTOSCOPY/URETEROSCOPY/HOLMIUM LASER/STONE EXTRACTION;  Surgeon: Raynelle Bring, MD;  Location: [facility];  Service: Urology;  Laterality: Left;  ONLY NEEDS 30 MIN FOR TOTAL PROCEDURE  . INGUINAL HERNIA REPAIR    . UMBILICAL HERNIA REPAIR    . VASECTOMY     Family History  Problem Relation Age of Onset  . Breast cancer Mother   . Heart disease Brother   . Colon cancer Neg Hx   . Esophageal cancer Neg Hx   . Stomach cancer Neg Hx    Social History   Tobacco Use  . Smoking status: Never Smoker  . Smokeless tobacco: Never Used  Substance Use Topics  . Alcohol use: No  . Drug use: No   Current Outpatient Medications  Medication Sig Dispense Refill  . aspirin 81 MG chewable tablet Chew 81 mg by mouth daily.     . cephALEXin (KEFLEX) 500 MG capsule Take 1 capsule (500 mg total) by mouth 3 (three) times daily. (Patient not taking: Reported on 02/10/2019) 30 capsule 0  . cetirizine (ZYRTEC) 10 MG tablet Take 10 mg by mouth daily.    . clopidogrel (PLAVIX) 75 MG tablet Take 75 mg by mouth daily.    Marland Kitchen  Coenzyme Q10 (COQ10) 200 MG CAPS Take 200 mg by mouth daily.     Marland Kitchen docusate sodium (COLACE) 100 MG capsule Take 200 mg by mouth at bedtime.    . isosorbide mononitrate (IMDUR) 60 MG 24 hr tablet Take 60 mg by mouth every morning.    Marland Kitchen lisinopril (PRINIVIL,ZESTRIL) 10 MG tablet Take 10 mg by mouth daily.     . metoprolol succinate (TOPROL-XL) 100 MG 24 hr tablet Take 100 mg by mouth daily. Take with or immediately following a meal.    . nitroGLYCERIN (NITROSTAT) 0.4 MG SL tablet Place 0.4 mg under the tongue every 5 (five) minutes as needed for chest pain.    . Omega-3 Fatty Acids (FISH OIL) 1200 MG CAPS Take 2,400 mg by mouth daily.     . rosuvastatin (CRESTOR) 10 MG tablet Take 10 mg by mouth at bedtime.     . thiamine (VITAMIN B-1) 100 MG tablet Take 100 mg by mouth daily.    . vitamin B-12 (CYANOCOBALAMIN) 500 MCG tablet Take 500 mcg by mouth daily.    . vitamin C (ASCORBIC ACID) 500 MG tablet Take 500 mg by mouth daily.      No current facility-administered medications for this visit.    No Known Allergies   Review of Systems: All systems reviewed and negative except where noted in HPI.   Labs reviewed in care everwhere  Physical Exam: Ht 5\' 11"  (1.803 m) Comment: pt provided over the phone  Wt 215 lb (97.5 kg) Comment: pt provided over the phone  BMI 29.99 kg/m  NA   ASSESSMENT AND PLAN:  73 y/o male here for reassessment of the following issues:  Colon cancer screening / antiplatelet - he had one small polyp removed on his first colonoscopy several years ago, his last 2 colonoscopies he has had no polyps removed. His last exam was done in 2013, good prep, no polyps. He has no family history of colon cancer, he has no alarm symptoms or concerning bowel changes.   We have reviewed national guidelines for colon cancer screening, which recommend he next be due for routine screening in 2023. He is anxious about this, he has been told in the past by other providers he needs this every 5 years, he does not feel comfortable waiting. I discussed risks / benefits of colonoscopy. He does have underlying CAD and on Plavix, seems asymptomatic and last EF is good. We would need to hold Plavix for 5 days prior to the exam, which can increase his risk for MI. He understands this risk, but for piece of mind wishes to have the exam done this year. I offered it to him in this light. Regardless of when he has this done, whether it now or in 3 years, if no significant findings, it will likely be his last exam due to age. I will put him on the wait list to be contacted for the exam, given the corona virus, no elective exams being done now, I anticipate this will not be done until summer time. We will  reach out to his cardiologist for approval to hold Plavix when his date has been scheduled, he can take aspirin in place of Plavix to have some preventative measure during this time. He agreed.   [place] Cellar, MD [facility]

## 2019-02-16 ENCOUNTER — Emergency Department (HOSPITAL_COMMUNITY): Payer: Medicare Other

## 2019-02-16 ENCOUNTER — Encounter (HOSPITAL_COMMUNITY): Payer: Self-pay | Admitting: Emergency Medicine

## 2019-02-16 ENCOUNTER — Inpatient Hospital Stay (HOSPITAL_COMMUNITY)
Admission: EM | Admit: 2019-02-16 | Discharge: 2019-02-18 | DRG: 872 | Disposition: A | Discharge status: Home / Self Care | Payer: Medicare Other | Attending: Family Medicine | Admitting: Family Medicine

## 2019-02-16 ENCOUNTER — Other Ambulatory Visit: Payer: Self-pay

## 2019-02-16 DIAGNOSIS — B9620 Unspecified Escherichia coli [E. coli] as the cause of diseases classified elsewhere: Secondary | ICD-10-CM | POA: Diagnosis present

## 2019-02-16 DIAGNOSIS — N200 Calculus of kidney: Secondary | ICD-10-CM | POA: Diagnosis present

## 2019-02-16 DIAGNOSIS — Z803 Family history of malignant neoplasm of breast: Secondary | ICD-10-CM

## 2019-02-16 DIAGNOSIS — N390 Urinary tract infection, site not specified: Secondary | ICD-10-CM | POA: Diagnosis present

## 2019-02-16 DIAGNOSIS — Z7902 Long term (current) use of antithrombotics/antiplatelets: Secondary | ICD-10-CM

## 2019-02-16 DIAGNOSIS — R310 Gross hematuria: Secondary | ICD-10-CM | POA: Diagnosis present

## 2019-02-16 DIAGNOSIS — Z87442 Personal history of urinary calculi: Secondary | ICD-10-CM

## 2019-02-16 DIAGNOSIS — Z7982 Long term (current) use of aspirin: Secondary | ICD-10-CM

## 2019-02-16 DIAGNOSIS — I2510 Atherosclerotic heart disease of native coronary artery without angina pectoris: Secondary | ICD-10-CM | POA: Diagnosis present

## 2019-02-16 DIAGNOSIS — N12 Tubulo-interstitial nephritis, not specified as acute or chronic: Secondary | ICD-10-CM | POA: Diagnosis not present

## 2019-02-16 DIAGNOSIS — R319 Hematuria, unspecified: Secondary | ICD-10-CM | POA: Diagnosis not present

## 2019-02-16 DIAGNOSIS — Z8249 Family history of ischemic heart disease and other diseases of the circulatory system: Secondary | ICD-10-CM

## 2019-02-16 DIAGNOSIS — I10 Essential (primary) hypertension: Secondary | ICD-10-CM | POA: Diagnosis present

## 2019-02-16 DIAGNOSIS — R011 Cardiac murmur, unspecified: Secondary | ICD-10-CM | POA: Diagnosis present

## 2019-02-16 DIAGNOSIS — K219 Gastro-esophageal reflux disease without esophagitis: Secondary | ICD-10-CM | POA: Diagnosis present

## 2019-02-16 DIAGNOSIS — A4151 Sepsis due to Escherichia coli [E. coli]: Principal | ICD-10-CM | POA: Diagnosis present

## 2019-02-16 DIAGNOSIS — E782 Mixed hyperlipidemia: Secondary | ICD-10-CM | POA: Diagnosis present

## 2019-02-16 LAB — BASIC METABOLIC PANEL
Anion gap: 11 (ref 5–15)
BUN: 18 mg/dL (ref 8–23)
CO2: 25 mmol/L (ref 22–32)
Calcium: 9.2 mg/dL (ref 8.9–10.3)
Chloride: 100 mmol/L (ref 98–111)
Creatinine, Ser: 0.91 mg/dL (ref 0.61–1.24)
GFR calc Af Amer: >60 mL/min (ref >60)
GFR calc non Af Amer: >60 mL/min (ref >60)
Glucose, Bld: 108 mg/dL — ABNORMAL HIGH (ref 70–99)
Potassium: 3.6 mmol/L (ref 3.5–5.1)
Sodium: 136 mmol/L (ref 135–145)

## 2019-02-16 LAB — URINALYSIS, ROUTINE W REFLEX MICROSCOPIC
Bacteria, UA: NONE SEEN
Bilirubin Urine: NEGATIVE
Glucose, UA: NEGATIVE mg/dL
Hgb urine dipstick: NEGATIVE
Ketones, ur: 5 mg/dL — AB
Nitrite: NEGATIVE
Protein, ur: 30 mg/dL — AB
Specific Gravity, Urine: 1.018 (ref 1.005–1.030)
WBC, UA: >50 WBC/hpf — ABNORMAL HIGH (ref 0–5)
pH: 5 (ref 5.0–8.0)

## 2019-02-16 LAB — CBC
HCT: 46.9 % (ref 39.0–52.0)
Hemoglobin: 15.4 g/dL (ref 13.0–17.0)
MCH: 30.7 pg (ref 26.0–34.0)
MCHC: 32.8 g/dL (ref 30.0–36.0)
MCV: 93.6 fL (ref 80.0–100.0)
Platelets: 157 10*3/uL (ref 150–400)
RBC: 5.01 MIL/uL (ref 4.22–5.81)
RDW: 12.3 % (ref 11.5–15.5)
WBC: 10.6 10*3/uL — ABNORMAL HIGH (ref 4.0–10.5)
nRBC: 0 % (ref 0.0–0.2)

## 2019-02-16 LAB — LACTIC ACID, PLASMA: Lactic Acid, Venous: 1.6 mmol/L (ref 0.5–1.9)

## 2019-02-16 MED ORDER — ISOSORBIDE MONONITRATE ER 60 MG PO TB24
60.0000 mg | ORAL_TABLET | Freq: Every day | ORAL | Status: DC
  Administered 2019-02-17 – 2019-02-18 (×2): 60 mg via ORAL
  Filled 2019-02-16 (×2): qty 1

## 2019-02-16 MED ORDER — ACETAMINOPHEN 325 MG PO TABS
650.0000 mg | ORAL_TABLET | Freq: Once | ORAL | Status: AC
  Administered 2019-02-16: 14:00:00 650 mg via ORAL
  Filled 2019-02-16: qty 2

## 2019-02-16 MED ORDER — NITROGLYCERIN 0.4 MG SL SUBL: 0.4000 mg | SUBLINGUAL_TABLET | SUBLINGUAL | Status: DC | PRN

## 2019-02-16 MED ORDER — ROSUVASTATIN CALCIUM 10 MG PO TABS
10.0000 mg | ORAL_TABLET | Freq: Every day | ORAL | Status: DC
  Administered 2019-02-16 – 2019-02-17 (×2): 10 mg via ORAL
  Filled 2019-02-16 (×2): qty 1

## 2019-02-16 MED ORDER — VITAMIN C 500 MG PO TABS
500.0000 mg | ORAL_TABLET | Freq: Every day | ORAL | Status: DC
  Administered 2019-02-17 – 2019-02-18 (×2): 500 mg via ORAL
  Filled 2019-02-16 (×2): qty 1

## 2019-02-16 MED ORDER — ENOXAPARIN SODIUM 40 MG/0.4ML ~~LOC~~ SOLN
40.0000 mg | SUBCUTANEOUS | Status: DC
  Filled 2019-02-16 (×2): qty 0.4

## 2019-02-16 MED ORDER — CLOPIDOGREL BISULFATE 75 MG PO TABS
75.0000 mg | ORAL_TABLET | Freq: Every day | ORAL | Status: DC
  Administered 2019-02-17 – 2019-02-18 (×2): 75 mg via ORAL
  Filled 2019-02-16 (×2): qty 1

## 2019-02-16 MED ORDER — SODIUM CHLORIDE 0.9 % IV SOLN
INTRAVENOUS | Status: DC
  Administered 2019-02-16 – 2019-02-17 (×2): via INTRAVENOUS

## 2019-02-16 MED ORDER — VITAMIN B-1 100 MG PO TABS
100.0000 mg | ORAL_TABLET | Freq: Every day | ORAL | Status: DC
  Administered 2019-02-17 – 2019-02-18 (×2): 100 mg via ORAL
  Filled 2019-02-16 (×2): qty 1

## 2019-02-16 MED ORDER — SODIUM CHLORIDE 0.9 % IV SOLN
1.0000 g | INTRAVENOUS | Status: DC
  Administered 2019-02-16 – 2019-02-17 (×2): 1 g via INTRAVENOUS
  Filled 2019-02-16 (×2): qty 1

## 2019-02-16 MED ORDER — CYANOCOBALAMIN 500 MCG PO TABS
500.0000 ug | ORAL_TABLET | Freq: Every day | ORAL | Status: DC
  Administered 2019-02-17 – 2019-02-18 (×2): 500 ug via ORAL
  Filled 2019-02-16 (×2): qty 1

## 2019-02-16 MED ORDER — METOPROLOL SUCCINATE ER 100 MG PO TB24
100.0000 mg | ORAL_TABLET | Freq: Every day | ORAL | Status: DC
  Administered 2019-02-17 – 2019-02-18 (×2): 100 mg via ORAL
  Filled 2019-02-16 (×2): qty 1

## 2019-02-16 MED ORDER — LISINOPRIL 10 MG PO TABS
10.0000 mg | ORAL_TABLET | Freq: Every day | ORAL | Status: DC
  Administered 2019-02-17 – 2019-02-18 (×2): 10 mg via ORAL
  Filled 2019-02-16 (×2): qty 1

## 2019-02-16 MED ORDER — ACETAMINOPHEN 325 MG PO TABS
650.0000 mg | ORAL_TABLET | Freq: Four times a day (QID) | ORAL | Status: DC | PRN
  Administered 2019-02-16 – 2019-02-17 (×2): 650 mg via ORAL
  Filled 2019-02-16 (×3): qty 2

## 2019-02-16 MED ORDER — DOCUSATE SODIUM 100 MG PO CAPS
200.0000 mg | ORAL_CAPSULE | Freq: Every day | ORAL | Status: DC
  Administered 2019-02-16 – 2019-02-17 (×2): 200 mg via ORAL
  Filled 2019-02-16 (×2): qty 2

## 2019-02-16 MED ORDER — ASPIRIN 81 MG PO CHEW
81.0000 mg | CHEWABLE_TABLET | Freq: Every day | ORAL | Status: DC
  Administered 2019-02-16 – 2019-02-17 (×2): 81 mg via ORAL
  Filled 2019-02-16 (×2): qty 1

## 2019-02-16 NOTE — ED Triage Notes (Signed)
Pt reports been having lower back pains for couple weeks. Reports having lower abd pains since last night with fevers. Having dysuria. Urologist sent over and will give orders since patient has fever and couldn't be seen in office.

## 2019-02-16 NOTE — ED Notes (Signed)
ED TO INPATIENT HANDOFF REPORT  ED Nurse Name and Phone #: Zed Wanninger  S Name/Age/Gender Henry Morris 73 y.o. male Room/Bed: WA14/WA14  Code Status   Code Status: Full Code  Home/SNF/Other Home Patient oriented to: self, place, time and situation Is this baseline? Yes   Triage Complete: Triage complete  Chief Complaint blood in urine; fever  Triage Note Pt reports been having lower back pains for couple weeks. Reports having lower abd pains since last night with fevers. Having dysuria. Urologist sent over and will give orders since patient has fever and couldn't be seen in office.    Allergies No Known Allergies  Level of Care/Admitting Diagnosis ED Disposition    ED Disposition Condition Comment   Admit  Hospital Area: [facility] [[id number]]  Level of Care: Med-Surg [16]  Covid Evaluation: N/A  Diagnosis: UTI (urinary tract infection) [672094]  Admitting Physician: Shelly Coss [[id number]]  Attending Physician: Shelly Coss [[id number]]  PT Class (Do Not Modify): Observation [104]  PT Acc Code (Do Not Modify): Observation [10022]       B Medical/Surgery History Past Medical History:  Diagnosis Date  . Angina, class II ([place])   . Arthritis of neck   . Benign fasciculations   . Blood in urine   . CAD (coronary artery disease)   . Cervical disc disease   . Complication of anesthesia    blood pressure may drop after anesthesia given (colonoscopy)  . Diverticula of colon   . Dyslipidemia   . Encounter for long-term (current) use of medications   . GERD without esophagitis   . Heart murmur   . History of colon polyps   . History of kidney stones   . HTN (hypertension)   . Hyperglycemia   . Hyperlipemia, mixed   . Neck pain   . Special screening for malignant neoplasm of prostate    Past Surgical History:  Procedure Laterality Date  . APPENDECTOMY    . CARDIAC CATHETERIZATION    . CATARACT EXTRACTION, BILATERAL    . COLONOSCOPY    .  CYSTOSCOPY/URETEROSCOPY/HOLMIUM LASER/STENT PLACEMENT Left 01/30/2018   Procedure: CYSTOSCOPY/URETEROSCOPY/HOLMIUM LASER/STONE EXTRACTION;  Surgeon: Raynelle Bring, MD;  Location: [facility];  Service: Urology;  Laterality: Left;  ONLY NEEDS 30 MIN FOR TOTAL PROCEDURE  . INGUINAL HERNIA REPAIR    . UMBILICAL HERNIA REPAIR    . VASECTOMY       A IV Location/Drains/Wounds Patient Lines/Drains/Airways Status   Active Line/Drains/Airways    Name:   Placement date:   Placement time:   Site:   Days:   Incision (Closed) 01/30/18 Perineum   01/30/18    1459     382          Intake/Output Last 24 hours No intake or output data in the 24 hours ending 02/16/19 1808  Labs/Imaging Results for orders placed or performed during the hospital encounter of 02/16/19 (from the past 48 hour(s))  Blood culture (routine x 2)     Status: None (Preliminary result)   Collection Time: 02/16/19  1:18 PM  Result Value Ref Range   Specimen Description      BLOOD RIGHT ANTECUBITAL Performed at [facility], [place] [address], [place], [place] [address]    Special Requests      BOTTLES DRAWN AEROBIC AND ANAEROBIC Blood Culture adequate volume Performed at [place] [address] [place], [place] [address]    Culture PENDING    Report Status PENDING  Urinalysis, Routine w reflex microscopic- may I&O cath if menses     Status: Abnormal   Collection Time: 02/16/19  1:25 PM  Result Value Ref Range   Color, Urine YELLOW YELLOW   APPearance HAZY (A) CLEAR   Specific Gravity, Urine 1.018 1.005 - 1.030   pH 5.0 5.0 - 8.0   Glucose, UA NEGATIVE NEGATIVE mg/dL   Hgb urine dipstick NEGATIVE NEGATIVE   Bilirubin Urine NEGATIVE NEGATIVE   Ketones, ur 5 (A) NEGATIVE mg/dL   Protein, ur 30 (A) NEGATIVE mg/dL   Nitrite NEGATIVE NEGATIVE   Leukocytes,Ua SMALL (A) NEGATIVE   RBC / HPF 6-10 0 - 5 RBC/hpf   WBC, UA >50 (H) 0 - 5 WBC/hpf   Bacteria, UA NONE SEEN NONE SEEN   Mucus PRESENT      Comment: Performed at [facility], [place] [address], [place], [place] [address]  CBC     Status: Abnormal   Collection Time: 02/16/19  1:25 PM  Result Value Ref Range   WBC 10.6 (H) 4.0 - 10.5 K/uL   RBC 5.01 4.22 - 5.81 MIL/uL   Hemoglobin 15.4 13.0 - 17.0 g/dL   HCT 46.9 39.0 - 52.0 %   MCV 93.6 80.0 - 100.0 fL   MCH 30.7 26.0 - 34.0 pg   MCHC 32.8 30.0 - 36.0 g/dL   RDW 12.3 11.5 - 15.5 %   Platelets 157 150 - 400 K/uL   nRBC 0.0 0.0 - 0.2 %    Comment: Performed at [facility], [place] [address], [place], [place] [address]  Basic metabolic panel     Status: Abnormal   Collection Time: 02/16/19  1:25 PM  Result Value Ref Range   Sodium 136 135 - 145 mmol/L   Potassium 3.6 3.5 - 5.1 mmol/L   Chloride 100 98 - 111 mmol/L   CO2 25 22 - 32 mmol/L   Glucose, Bld 108 (H) 70 - 99 mg/dL   BUN 18 8 - 23 mg/dL   Creatinine, Ser 0.91 0.61 - 1.24 mg/dL   Calcium 9.2 8.9 - 10.3 mg/dL   GFR calc non Af Amer >60 >60 mL/min   GFR calc Af Amer >60 >60 mL/min   Anion gap 11 5 - 15    Comment: Performed at [facility], [place] [address], [place], [place] [address]  Lactic acid, plasma     Status: None   Collection Time: 02/16/19  1:25 PM  Result Value Ref Range   Lactic Acid, Venous 1.6 0.5 - 1.9 mmol/L    Comment: Performed at [facility], [place] [address], [place],  [address]   Ct Renal Stone Study  Result Date: 02/16/2019 CLINICAL DATA:  Hematuria, lower back pain EXAM: CT ABDOMEN AND PELVIS WITHOUT CONTRAST TECHNIQUE: Multidetector CT imaging of the abdomen and pelvis was performed following the standard protocol without IV contrast. COMPARISON:  CT abdomen pelvis, 01/31/2018 FINDINGS: Lower chest: No acute abnormality. Mild fibrotic change in the included lung bases, unchanged from prior. Coronary artery calcifications. Hepatobiliary: No focal liver abnormality is seen. No gallstones, gallbladder wall thickening,  or biliary dilatation. Pancreas: Unremarkable. No pancreatic ductal dilatation or surrounding inflammatory changes. Spleen: Normal in size without focal abnormality. Adrenals/Urinary Tract: Adrenal glands are unremarkable. Multiple small nonobstructive calculi of the right kidney. Multiple large bilateral exophytic renal cysts, including an inferior pole cyst of the right kidney measuring at least 11.7 cm. Bladder is unremarkable. Stomach/Bowel: Stomach is within  normal limits. No evidence of bowel wall thickening, distention, or inflammatory changes. Sigmoid diverticulosis. Vascular/Lymphatic: Calcific atherosclerosis. No enlarged abdominal or pelvic lymph nodes. Reproductive: No mass or other abnormality. Other: Small fat containing left inguinal hernia. No abdominopelvic ascites. Musculoskeletal: No acute or significant osseous findings. IMPRESSION: 1. Multiple small nonobstructive calculi of the right kidney. No evidence of left-sided urinary tract calculus or hydronephrosis. 2. Multiple large bilateral exophytic renal cysts, including an inferior pole cyst of the right kidney measuring at least 11.7 cm. Very large renal cysts are occasionally symptomatic. 3.  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 4.  Coronary artery disease. Electronically Signed   By: Eddie Candle M.D.   On: 02/16/2019 15:47    Pending Labs Unresulted Labs (From admission, onward)    Start     Ordered   02/17/19 6812  Basic metabolic panel  Tomorrow morning,   R     02/16/19 1729   02/17/19 0500  CBC  Tomorrow morning,   R     02/16/19 1729   02/16/19 1704  Culture, blood (routine x 2)  BLOOD CULTURE X 2,   STAT     02/16/19 1703   02/16/19 1306  Urine C&S  Once,   STAT     02/16/19 1305          Vitals/Pain Today's Vitals   02/16/19 1600 02/16/19 1630 02/16/19 1700 02/16/19 1800  BP: 112/76 114/83 125/87 116/81  Pulse: 100 100 99 91  Resp: 14 14 14 14   Temp:      TempSrc:      SpO2: 96% 95% 96% 96%   PainSc:        Isolation Precautions No active isolations  Medications Medications  cefTRIAXone (ROCEPHIN) 1 g in sodium chloride 0.9 % 100 mL IVPB (has no administration in time range)  0.9 %  sodium chloride infusion (has no administration in time range)  aspirin chewable tablet 81 mg (has no administration in time range)  isosorbide mononitrate (IMDUR) 24 hr tablet 60 mg (has no administration in time range)  lisinopril (ZESTRIL) tablet 10 mg (has no administration in time range)  metoprolol succinate (TOPROL-XL) 24 hr tablet 100 mg (has no administration in time range)  nitroGLYCERIN (NITROSTAT) SL tablet 0.4 mg (has no administration in time range)  rosuvastatin (CRESTOR) tablet 10 mg (has no administration in time range)  docusate sodium (COLACE) capsule 200 mg (has no administration in time range)  clopidogrel (PLAVIX) tablet 75 mg (has no administration in time range)  vitamin B-12 (CYANOCOBALAMIN) tablet 500 mcg (has no administration in time range)  thiamine (VITAMIN B-1) tablet 100 mg (has no administration in time range)  vitamin C (ASCORBIC ACID) tablet 500 mg (has no administration in time range)  enoxaparin (LOVENOX) injection 40 mg (has no administration in time range)  acetaminophen (TYLENOL) tablet 650 mg (650 mg Oral Given 02/16/19 1351)    Mobility walks Low fall risk   Focused Assessments     R Recommendations: See Admitting Provider Note  Report given to:   Additional Notes:

## 2019-02-16 NOTE — ED Provider Notes (Signed)
Medical screening examination/treatment/procedure(s) were conducted as a shared visit with non-physician practitioner(s) and myself.  I personally evaluated the patient during the encounter.  UA consistent with UTI.  Renal CT is pending.  Fever in the ED but no clinical signs suggesting sepsis.  Plan on abx, consult with urology once CT results.   Dorie Rank, MD 02/16/19 [phone]

## 2019-02-16 NOTE — ED Notes (Signed)
ED Provider at bedside. 

## 2019-02-16 NOTE — H&P (Signed)
History and Physical    Henry Morris JZP:[id number] DOB: 04-05-1946 DOA: 02/16/2019  PCP: Eber Hong, MD   Patient coming from:     Chief Complaint:   HPI: Henry Morris is a 73 y.o. male with medical history significant of CAD,HTN,HLD , chronic renal cysts, nephrolithiasis who presented to the emergency department today with complaints of fever, dysuria and hematuria.  Patient said he had a rough week.  Last Friday night he had a very painful urination which he describes as the worst pain in his life.  He also saw blood in the commode in his urine.  He also developed a fever of 101 F.  He went to an urgent care center on Saturday.  Urine culture was taken and he was given a shot of antibiotics and was sent on oral abx.  He continued to have fever on Sunday up to 102.6.  This morning he called his urologist Dr. Alinda Money and he was advised to come to the emergency department . Patient says he has been having issues with his urinary tract . He had kidney stone surgery last year by Dr. Alinda Money.  Since then he had hematuria on and off.  Patient has history of chronic renal cysts. He has history of coronary artery disease and follows with Dr. Jimmye Norman at [facility]. Patient seen and examined at the bedside in ED. currently he is hemodynamically stable.  He was febrile up to 9 F when he presented.  Renal function is intact. Patient denies any chest pain, shortness of breath, palpitations, flank pain, nausea, vomiting, diarrhea or headache.   ED Course: Urinalysis was suggestive of urinary tract infection.  Started on ceftriaxone.  Urine culture, blood culture taken.  ED staff discussed with urology    Past Medical History:  Diagnosis Date   Angina, class II (HCC)    Arthritis of neck    Benign fasciculations    Blood in urine    CAD (coronary artery disease)    Cervical disc disease    Complication of anesthesia    blood pressure may drop after anesthesia given (colonoscopy)    Diverticula of colon    Dyslipidemia    Encounter for long-term (current) use of medications    GERD without esophagitis    Heart murmur    History of colon polyps    History of kidney stones    HTN (hypertension)    Hyperglycemia    Hyperlipemia, mixed    Neck pain    Special screening for malignant neoplasm of prostate     Past Surgical History:  Procedure Laterality Date   APPENDECTOMY     CARDIAC CATHETERIZATION     CATARACT EXTRACTION, BILATERAL     COLONOSCOPY     CYSTOSCOPY/URETEROSCOPY/HOLMIUM LASER/STENT PLACEMENT Left 01/30/2018   Procedure: CYSTOSCOPY/URETEROSCOPY/HOLMIUM LASER/STONE EXTRACTION;  Surgeon: Raynelle Bring, MD;  Location: [facility];  Service: Urology;  Laterality: Left;  ONLY NEEDS 30 MIN FOR TOTAL PROCEDURE   INGUINAL HERNIA REPAIR     UMBILICAL HERNIA REPAIR     VASECTOMY       reports that he has never smoked. He has never used smokeless tobacco. He reports that he does not drink alcohol or use drugs.  No Known Allergies  Family History  Problem Relation Age of Onset   Breast cancer Mother    Heart disease Brother    Colon cancer Neg Hx    Esophageal cancer Neg Hx    Stomach cancer Neg Hx  Prior to Admission medications   Medication Sig Start Date End Date Taking? Authorizing Provider  aspirin 81 MG chewable tablet Chew 81 mg by mouth at bedtime.    Yes [provider]  cetirizine (ZYRTEC) 10 MG tablet Take 10 mg by mouth daily.   Yes [provider]  clopidogrel (PLAVIX) 75 MG tablet Take 75 mg by mouth daily.   Yes [provider]  Coenzyme Q10 (COQ10) 200 MG CAPS Take 200 mg by mouth daily.    Yes [provider]  docusate sodium (COLACE) 100 MG capsule Take 200 mg by mouth at bedtime.   Yes [provider]  isosorbide mononitrate (IMDUR) 60 MG 24 hr tablet Take 60 mg by mouth every morning.   Yes [provider]  lisinopril (PRINIVIL,ZESTRIL) 10 MG tablet Take  10 mg by mouth daily.    Yes [provider]  metoprolol succinate (TOPROL-XL) 100 MG 24 hr tablet Take 100 mg by mouth daily. Take with or immediately following a meal.   Yes [provider]  nitroGLYCERIN (NITROSTAT) 0.4 MG SL tablet Place 0.4 mg under the tongue every 5 (five) minutes as needed for chest pain.   Yes [provider]  Omega-3 Fatty Acids (FISH OIL) 1200 MG CAPS Take 2,400 mg by mouth daily.    Yes [provider]  rosuvastatin (CRESTOR) 10 MG tablet Take 10 mg by mouth at bedtime.   Yes [provider]  thiamine (VITAMIN B-1) 100 MG tablet Take 100 mg by mouth daily.   Yes [provider]  vitamin B-12 (CYANOCOBALAMIN) 500 MCG tablet Take 500 mcg by mouth daily.   Yes [provider]  vitamin C (ASCORBIC ACID) 500 MG tablet Take 500 mg by mouth daily.    Yes [provider]    Physical Exam: Vitals:   02/16/19 1536 02/16/19 1600 02/16/19 1630 02/16/19 1700  BP: 126/78 112/76 114/83 125/87  Pulse: 100 100 100 99  Resp: 16 14 14 14   Temp:      TempSrc:      SpO2: 95% 96% 95% 96%    Constitutional: NAD, calm, comfortable,very pleasant gentleman Vitals:   02/16/19 1536 02/16/19 1600 02/16/19 1630 02/16/19 1700  BP: 126/78 112/76 114/83 125/87  Pulse: 100 100 100 99  Resp: 16 14 14 14   Temp:      TempSrc:      SpO2: 95% 96% 95% 96%   Eyes: PERRL, lids and conjunctivae normal ENMT: Mucous membranes are moist. Posterior pharynx clear of any exudate or lesions.Normal dentition.  Neck: normal, supple, no masses, no thyromegaly Respiratory: clear to auscultation bilaterally, no wheezing, no crackles. Normal respiratory effort. No accessory muscle use.  Cardiovascular: Regular rate and rhythm, no murmurs / rubs / gallops. No extremity edema. 2+ pedal pulses. No carotid bruits.  Abdomen: no tenderness, no masses palpated. No hepatosplenomegaly. Bowel sounds positive.  Musculoskeletal: no clubbing /  cyanosis. No joint deformity upper and lower extremities. Good ROM, no contractures. Normal muscle tone.  Skin: no rashes, lesions, ulcers. No induration Neurologic: CN 2-12 grossly intact. Sensation intact, DTR normal. Strength 5/5 in all 4.  Psychiatric: Normal judgment and insight. Alert and oriented x 3. Normal mood.   Foley Catheter:None  Labs on Admission: I have personally reviewed following labs and imaging studies  CBC: Recent Labs  Lab 02/16/19 1325  WBC 10.6*  HGB 15.4  HCT 46.9  MCV 93.6  PLT 622   Basic Metabolic Panel: Recent Labs  Lab 02/16/19 1325  NA 136  K 3.6  CL 100  CO2 25  GLUCOSE 108*  BUN 18  CREATININE 0.91  CALCIUM 9.2   GFR: Estimated Creatinine Clearance: 86.1 mL/min (by C-G formula based on SCr of 0.91 mg/dL). Liver Function Tests: No results for input(s): AST, ALT, ALKPHOS, BILITOT, PROT, ALBUMIN in the last 168 hours. No results for input(s): LIPASE, AMYLASE in the last 168 hours. No results for input(s): AMMONIA in the last 168 hours. Coagulation Profile: No results for input(s): INR, PROTIME in the last 168 hours. Cardiac Enzymes: No results for input(s): CKTOTAL, CKMB, CKMBINDEX, TROPONINI in the last 168 hours. BNP (last 3 results) No results for input(s): PROBNP in the last 8760 hours. HbA1C: No results for input(s): HGBA1C in the last 72 hours. CBG: No results for input(s): GLUCAP in the last 168 hours. Lipid Profile: No results for input(s): CHOL, HDL, LDLCALC, TRIG, CHOLHDL, LDLDIRECT in the last 72 hours. Thyroid Function Tests: No results for input(s): TSH, T4TOTAL, FREET4, T3FREE, THYROIDAB in the last 72 hours. Anemia Panel: No results for input(s): VITAMINB12, FOLATE, FERRITIN, TIBC, IRON, RETICCTPCT in the last 72 hours. Urine analysis:    Component Value Date/Time   COLORURINE YELLOW 02/16/2019 1325   APPEARANCEUR HAZY (A) 02/16/2019 1325   LABSPEC 1.018 02/16/2019 1325   PHURINE 5.0 02/16/2019 1325   GLUCOSEU  NEGATIVE 02/16/2019 1325   HGBUR NEGATIVE 02/16/2019 1325   BILIRUBINUR NEGATIVE 02/16/2019 1325   KETONESUR 5 (A) 02/16/2019 1325   PROTEINUR 30 (A) 02/16/2019 1325   NITRITE NEGATIVE 02/16/2019 1325   LEUKOCYTESUR SMALL (A) 02/16/2019 1325    Radiological Exams on Admission: Ct Renal Stone Study  Result Date: 02/16/2019 CLINICAL DATA:  Hematuria, lower back pain EXAM: CT ABDOMEN AND PELVIS WITHOUT CONTRAST TECHNIQUE: Multidetector CT imaging of the abdomen and pelvis was performed following the standard protocol without IV contrast. COMPARISON:  CT abdomen pelvis, 01/31/2018 FINDINGS: Lower chest: No acute abnormality. Mild fibrotic change in the included lung bases, unchanged from prior. Coronary artery calcifications. Hepatobiliary: No focal liver abnormality is seen. No gallstones, gallbladder wall thickening, or biliary dilatation. Pancreas: Unremarkable. No pancreatic ductal dilatation or surrounding inflammatory changes. Spleen: Normal in size without focal abnormality. Adrenals/Urinary Tract: Adrenal glands are unremarkable. Multiple small nonobstructive calculi of the right kidney. Multiple large bilateral exophytic renal cysts, including an inferior pole cyst of the right kidney measuring at least 11.7 cm. Bladder is unremarkable. Stomach/Bowel: Stomach is within normal limits. No evidence of bowel wall thickening, distention, or inflammatory changes. Sigmoid diverticulosis. Vascular/Lymphatic: Calcific atherosclerosis. No enlarged abdominal or pelvic lymph nodes. Reproductive: No mass or other abnormality. Other: Small fat containing left inguinal hernia. No abdominopelvic ascites. Musculoskeletal: No acute or significant osseous findings. IMPRESSION: 1. Multiple small nonobstructive calculi of the right kidney. No evidence of left-sided urinary tract calculus or hydronephrosis. 2. Multiple large bilateral exophytic renal cysts, including an inferior pole cyst of the right kidney measuring at  least 11.7 cm. Very large renal cysts are occasionally symptomatic. 3.  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 4.  Coronary artery disease. Electronically Signed   By: Eddie Candle M.D.   On: 02/16/2019 15:47     Assessment/Plan Active Problems:   UTI (urinary tract infection)  Suspected sepsis: Secondary to urinary tract infection.  Febrile on presentation.  Blood cultures and urine cultures taken.  Continue gentle IV fluid.  Tylenol for fever.  Complicated urinary tract infection: Urinary tract infection on the background of  nephrolithiasis.  Presented with hematuria, dysuria and fever.  CT stone study showed multiple small nonobstructing calculi in the right kidney.  Started on ceftriaxone.  He was prescribed oral antibiotics by urgent care.  Follow-up cultures. ED staff had discussed with Dr. Alinda Money.  Will follow up with Dr. Alinda Money  Tomorrow.  History of coronary artery disease: Currently stable.  Follows with Dr. Jimmye Norman, cardiology, at [facility].  On aspirin, Plavix, metoprolol at home which we will continue.  Denies any chest pain.  Hypertension: On lisinopril and Imdur at home which we will continue.  Currently blood pressure stable.  Hyperlipidemia: Continue Crestor    Severity of Illness: The appropriate patient status for this patient is OBSERVATION.    DVT prophylaxis: Lovenox Code Status: Full Family Communication: None present at the bed side Consults called: ED discussed with urology     Shelly Coss MD [facility] Pager [phone]  If 7PM-7AM, please contact night-coverage [email or web address] Password [facility]  02/16/2019, 5:29 PM

## 2019-02-16 NOTE — ED Provider Notes (Signed)
[facility] DEPT Provider Note   CSN: [id number] Arrival date & time: 02/16/19  1243    History   Chief Complaint Chief Complaint  Patient presents with  . Back Pain  . Fever  . Abdominal Pain  . sent by urologist    HPI Henry Morris is a 73 y.o. male who presents with a fever and hematuria. PMH significant for kidney stones, CAD, HTN, HLD. He states that he has had microscopic hematuria in his urine since he had a kidney stone removal last year by Dr. Alinda Money. 3 days ago he started to feel unwell. His back was achy and he thought it was from sore muscles. He reports low grade fever and severe pain when he had to urinate. It felt like the urine was "pushing against something". He has gross hematuria associated with this. Saturday morning he went to UC and they gave him IM Rocephin and Levaquin. They obtained a CBC and his WBC was 13.9. They sent of a urine culture but he hasn't received the results from this. He has had a Tmax of 102 and has been taking Tylenol for this. He called [facility] today and they told him to come to the ED. He reports very mild R sided groin pain but otherwise no chest pain, SOB, cough, abdominal pain, diarrhea, vomiting, penis or testicular pain.      HPI  Past Medical History:  Diagnosis Date  . Angina, class II ([place])   . Arthritis of neck   . Benign fasciculations   . Blood in urine   . CAD (coronary artery disease)   . Cervical disc disease   . Complication of anesthesia    blood pressure may drop after anesthesia given (colonoscopy)  . Diverticula of colon   . Dyslipidemia   . Encounter for long-term (current) use of medications   . GERD without esophagitis   . Heart murmur   . History of colon polyps   . History of kidney stones   . HTN (hypertension)   . Hyperglycemia   . Hyperlipemia, mixed   . Neck pain   . Special screening for malignant neoplasm of prostate     There are no active problems to  display for this patient.   Past Surgical History:  Procedure Laterality Date  . APPENDECTOMY    . CARDIAC CATHETERIZATION    . CATARACT EXTRACTION, BILATERAL    . COLONOSCOPY    . CYSTOSCOPY/URETEROSCOPY/HOLMIUM LASER/STENT PLACEMENT Left 01/30/2018   Procedure: CYSTOSCOPY/URETEROSCOPY/HOLMIUM LASER/STONE EXTRACTION;  Surgeon: Raynelle Bring, MD;  Location: [facility];  Service: Urology;  Laterality: Left;  ONLY NEEDS 30 MIN FOR TOTAL PROCEDURE  . INGUINAL HERNIA REPAIR    . UMBILICAL HERNIA REPAIR    . VASECTOMY          Home Medications    Prior to Admission medications   Medication Sig Start Date End Date Taking? Authorizing Provider  aspirin 81 MG chewable tablet Chew 81 mg by mouth daily.     [provider]  cephALEXin (KEFLEX) 500 MG capsule Take 1 capsule (500 mg total) by mouth 3 (three) times daily. Patient not taking: Reported on 02/10/2019 01/31/18   Ezequiel Essex, MD  cetirizine (ZYRTEC) 10 MG tablet Take 10 mg by mouth daily.    [provider]  clopidogrel (PLAVIX) 75 MG tablet Take 75 mg by mouth daily.    [provider]  Coenzyme Q10 (COQ10) 200 MG CAPS Take 200 mg by mouth daily.  [provider]  docusate sodium (COLACE) 100 MG capsule Take 200 mg by mouth at bedtime.    [provider]  isosorbide mononitrate (IMDUR) 60 MG 24 hr tablet Take 60 mg by mouth every morning.    [provider]  lisinopril (PRINIVIL,ZESTRIL) 10 MG tablet Take 10 mg by mouth daily.     [provider]  metoprolol succinate (TOPROL-XL) 100 MG 24 hr tablet Take 100 mg by mouth daily. Take with or immediately following a meal.    [provider]  nitroGLYCERIN (NITROSTAT) 0.4 MG SL tablet Place 0.4 mg under the tongue every 5 (five) minutes as needed for chest pain.    [provider]  Omega-3 Fatty Acids (FISH OIL) 1200 MG CAPS Take 2,400 mg by mouth daily.     [provider]  rosuvastatin  (CRESTOR) 10 MG tablet Take 10 mg by mouth at bedtime.    [provider]  thiamine (VITAMIN B-1) 100 MG tablet Take 100 mg by mouth daily.    [provider]  vitamin B-12 (CYANOCOBALAMIN) 500 MCG tablet Take 500 mcg by mouth daily.    [provider]  vitamin C (ASCORBIC ACID) 500 MG tablet Take 500 mg by mouth daily.     [provider]    Family History Family History  Problem Relation Age of Onset  . Breast cancer Mother   . Heart disease Brother   . Colon cancer Neg Hx   . Esophageal cancer Neg Hx   . Stomach cancer Neg Hx     Social History Social History   Tobacco Use  . Smoking status: Never Smoker  . Smokeless tobacco: Never Used  Substance Use Topics  . Alcohol use: No  . Drug use: No     Allergies   Patient has no known allergies.   Review of Systems Review of Systems  Constitutional: Positive for fever.  Respiratory: Negative for cough and shortness of breath.   Cardiovascular: Negative for chest pain.  Gastrointestinal: Negative for abdominal pain, diarrhea, nausea and vomiting.  Genitourinary: Positive for difficulty urinating, dysuria, flank pain and hematuria. Negative for penile pain.  Musculoskeletal: Negative for back pain and myalgias.     Physical Exam Updated Vital Signs BP (!) 162/92 (BP Location: Right Arm)   Pulse (!) 123   Temp (!) 100.7 F (38.2 C) (Oral)   Resp 20   SpO2 96%   Physical Exam Vitals signs and nursing note reviewed.  Constitutional:      General: He is not in acute distress.    Appearance: He is well-developed. He is not ill-appearing.  HENT:     Head: Normocephalic and atraumatic.  Eyes:     General: No scleral icterus.       Right eye: No discharge.        Left eye: No discharge.     Conjunctiva/sclera: Conjunctivae normal.     Pupils: Pupils are equal, round, and reactive to light.  Neck:     Musculoskeletal: Normal range of motion.  Cardiovascular:     Rate and  Rhythm: Tachycardia present.  Pulmonary:     Effort: Pulmonary effort is normal. No respiratory distress.     Breath sounds: Normal breath sounds.  Abdominal:     General: There is no distension.     Palpations: Abdomen is soft.     Tenderness: There is no abdominal tenderness. There is no right CVA tenderness or left CVA tenderness.  Skin:  General: Skin is warm and dry.  Neurological:     Mental Status: He is alert and oriented to person, place, and time.  Psychiatric:        Behavior: Behavior normal.      ED Treatments / Results  Labs (all labs ordered are listed, but only abnormal results are displayed) Labs Reviewed  CBC - Abnormal; Notable for the following components:      Result Value   WBC 10.6 (*)    All other components within normal limits  URINE CULTURE  CULTURE, BLOOD (ROUTINE X 2)  URINALYSIS, ROUTINE W REFLEX MICROSCOPIC  BASIC METABOLIC PANEL  LACTIC ACID, PLASMA  LACTIC ACID, PLASMA    EKG None  Radiology No results found.  Procedures Procedures (including critical care time)  Medications Ordered in ED Medications  acetaminophen (TYLENOL) tablet 650 mg (650 mg Oral Given 02/16/19 1351)     Initial Impression / Assessment and Plan / ED Course  I have reviewed the triage vital signs and the nursing notes.  Pertinent labs & imaging results that were available during my care of the patient were reviewed by me and considered in my medical decision making (see chart for details).  73 year old male presents with fever, dysuria, hematuria, and R flank/groin pain for a couple days. He is tachycardic, febrile, and hypertensive. Overall he appears well. He is having mild pain on exam. There is no N/V. Will obtain basic labs, UA. Will consult urology as they requested to be contacted when he got here.   2:54 PM Discussed with Dr. Lovena Neighbours who is on call today. He recommends basic labs, UA, CT.   CBC is remarkable for mild leukocytosis (10.6). BMP is  normal. Lactic acid is normal. Blood cultures were ordered. UA shows 5 ketones, 30 protein, no bacteria, >50 WBC. Urine culture sent. CT renal does not show obstructing kidney stone but he has multiple large right sided renal cysts and non-obstructing kidney stones. Shared visit with Dr. Tomi Bamberger. Rocephin and Tylenol were given. Discussed with Dr. Tawanna Solo with [facility] who will admit for failure of outpatient tx.  Final Clinical Impressions(s) / ED Diagnoses   Final diagnoses:  Pyelonephritis    ED Discharge Orders    None       Recardo Evangelist, PA-C 02/16/19 1750    Dorie Rank, MD 02/17/19 1459

## 2019-02-17 DIAGNOSIS — I10 Essential (primary) hypertension: Secondary | ICD-10-CM | POA: Diagnosis present

## 2019-02-17 DIAGNOSIS — Z7982 Long term (current) use of aspirin: Secondary | ICD-10-CM | POA: Diagnosis not present

## 2019-02-17 DIAGNOSIS — Z8249 Family history of ischemic heart disease and other diseases of the circulatory system: Secondary | ICD-10-CM | POA: Diagnosis not present

## 2019-02-17 DIAGNOSIS — R310 Gross hematuria: Secondary | ICD-10-CM | POA: Diagnosis present

## 2019-02-17 DIAGNOSIS — Z87442 Personal history of urinary calculi: Secondary | ICD-10-CM | POA: Diagnosis not present

## 2019-02-17 DIAGNOSIS — A4151 Sepsis due to Escherichia coli [E. coli]: Secondary | ICD-10-CM | POA: Diagnosis present

## 2019-02-17 DIAGNOSIS — Z803 Family history of malignant neoplasm of breast: Secondary | ICD-10-CM | POA: Diagnosis not present

## 2019-02-17 DIAGNOSIS — N390 Urinary tract infection, site not specified: Secondary | ICD-10-CM | POA: Diagnosis present

## 2019-02-17 DIAGNOSIS — K219 Gastro-esophageal reflux disease without esophagitis: Secondary | ICD-10-CM | POA: Diagnosis present

## 2019-02-17 DIAGNOSIS — Z7902 Long term (current) use of antithrombotics/antiplatelets: Secondary | ICD-10-CM | POA: Diagnosis not present

## 2019-02-17 DIAGNOSIS — R319 Hematuria, unspecified: Secondary | ICD-10-CM | POA: Diagnosis not present

## 2019-02-17 DIAGNOSIS — E782 Mixed hyperlipidemia: Secondary | ICD-10-CM | POA: Diagnosis present

## 2019-02-17 DIAGNOSIS — N200 Calculus of kidney: Secondary | ICD-10-CM | POA: Diagnosis present

## 2019-02-17 DIAGNOSIS — B9620 Unspecified Escherichia coli [E. coli] as the cause of diseases classified elsewhere: Secondary | ICD-10-CM | POA: Diagnosis present

## 2019-02-17 DIAGNOSIS — I2510 Atherosclerotic heart disease of native coronary artery without angina pectoris: Secondary | ICD-10-CM | POA: Diagnosis present

## 2019-02-17 DIAGNOSIS — N12 Tubulo-interstitial nephritis, not specified as acute or chronic: Secondary | ICD-10-CM | POA: Diagnosis present

## 2019-02-17 DIAGNOSIS — R011 Cardiac murmur, unspecified: Secondary | ICD-10-CM | POA: Diagnosis present

## 2019-02-17 LAB — URINE CULTURE: Culture: NO GROWTH

## 2019-02-17 LAB — CBC
HCT: 41.7 % (ref 39.0–52.0)
Hemoglobin: 14.2 g/dL (ref 13.0–17.0)
MCH: 31.9 pg (ref 26.0–34.0)
MCHC: 34.1 g/dL (ref 30.0–36.0)
MCV: 93.7 fL (ref 80.0–100.0)
Platelets: 139 10*3/uL — ABNORMAL LOW (ref 150–400)
RBC: 4.45 MIL/uL (ref 4.22–5.81)
RDW: 12.4 % (ref 11.5–15.5)
WBC: 6.4 10*3/uL (ref 4.0–10.5)
nRBC: 0 % (ref 0.0–0.2)

## 2019-02-17 LAB — BASIC METABOLIC PANEL
Anion gap: 9 (ref 5–15)
BUN: 17 mg/dL (ref 8–23)
CO2: 23 mmol/L (ref 22–32)
Calcium: 8.6 mg/dL — ABNORMAL LOW (ref 8.9–10.3)
Chloride: 104 mmol/L (ref 98–111)
Creatinine, Ser: 0.75 mg/dL (ref 0.61–1.24)
GFR calc Af Amer: >60 mL/min (ref >60)
GFR calc non Af Amer: >60 mL/min (ref >60)
Glucose, Bld: 98 mg/dL (ref 70–99)
Potassium: 3.7 mmol/L (ref 3.5–5.1)
Sodium: 136 mmol/L (ref 135–145)

## 2019-02-17 NOTE — Progress Notes (Addendum)
Patient notified this RN around [phone] that he received a call from the urgent care he went to prior to this admission. They informed him that his urine culture showed the bacteria e. Coli growing. MD notified and patient was already on antibiotics.

## 2019-02-17 NOTE — Progress Notes (Signed)
PROGRESS NOTE    Henry Morris  BHA:[id number] DOB: [birth year] DOA: 02/16/2019 PCP: Eber Hong, MD   Brief Narrative: Henry Morris is a 73 y.o. male with medical history significant of CAD,HTN,HLD , chronic renal cysts, nephrolithiasis who presented to the emergency department today with complaints of fever, dysuria and hematuria.Patient says he has been having issues with his urinary tract . He had kidney stone surgery last year by Dr. Alinda Money.  Since then he had hematuria on and off.  He presented to ED as advised by his urologist,Dr Alinda Money. Patient was admitted for the management of possible sepsis secondary to urinary tract infection.He was still having mild grade fever this morning.  His cultures are pending  Assessment & Plan:   Active Problems:   UTI (urinary tract infection)   Suspected sepsis: Secondary to urinary tract infection.  Febrile on presentation.  Blood cultures and urine cultures pending.  Continue gentle IV fluid.  Tylenol for fever.Still has mild grade fever this morning.  Complicated urinary tract infection: Urinary tract infection on the background of nephrolithiasis.  Presented with hematuria, dysuria and fever.  CT stone study showed multiple small nonobstructing calculi in the right kidney.  Started on ceftriaxone.  He was prescribed oral antibiotics by urgent care.  Follow-up cultures.  History of coronary artery disease: Currently stable.  Follows with Dr. Jimmye Norman, cardiology, at [facility].  On aspirin, Plavix, metoprolol at home which we will continue.  Denies any chest pain.  Hypertension: On lisinopril and Imdur at home which we will continue.  Currently blood pressure stable.  Hyperlipidemia: Continue Crestor         DVT prophylaxis:Lovenox Code Status: Full Family Communication: None present at the bedside Disposition Plan: Home in 1 to 2 days after resolution of fever, final culture and sensitivity report.   Consultants: Paged Dr Alinda Money  just for discussion ,waiting for call back. No need to call official consult  Procedures:None  Antimicrobials:  Anti-infectives (From admission, onward)   Start     Dose/Rate Route Frequency Ordered Stop   02/16/19 1800  cefTRIAXone (ROCEPHIN) 1 g in sodium chloride 0.9 % 100 mL IVPB     1 g 200 mL/hr over 30 Minutes Intravenous Every 24 hours 02/16/19 1726        Subjective: Patient seen and examined the bedside this morning.  Remains comfortable.  Had mild grade fever this morning.  Hemodynamically stable.  Denies any dysuria or hematuria this morning.  Objective: Vitals:   02/16/19 2004 02/16/19 2152 02/17/19 0458 02/17/19 0729  BP: 134/79  122/71 133/85  Pulse: 90  84 86  Resp: 17  17 18   Temp: (!) 100.6 F (38.1 C) 99.4 F (37.4 C) 98.9 F (37.2 C) 100.2 F (37.9 C)  TempSrc:  Oral  Oral  SpO2: 97%  97% 96%    Intake/Output Summary (Last 24 hours) at 02/17/2019 1344 Last data filed at 02/17/2019 0847 Gross per 24 hour  Intake 760.85 ml  Output -  Net 760.85 ml   There were no vitals filed for this visit.  Examination:  General exam: Appears calm and comfortable ,Not in distress,average built HEENT:PERRL,Oral mucosa moist, Ear/Nose normal on gross exam Respiratory system: Bilateral equal air entry, normal vesicular breath sounds, no wheezes or crackles  Cardiovascular system: S1 & S2 heard, RRR. No JVD, murmurs, rubs, gallops or clicks. No pedal edema. Gastrointestinal system: Abdomen is nondistended, soft and nontender. No organomegaly or masses felt. Normal bowel sounds heard. Central nervous system: Alert and  oriented. No focal neurological deficits. Extremities: No edema, no clubbing ,no cyanosis, distal peripheral pulses palpable. Skin: No rashes, lesions or ulcers,no icterus ,no pallor MSK: Normal muscle bulk,tone ,power Psychiatry: Judgement and insight appear normal. Mood & affect appropriate.     Data Reviewed: I have personally reviewed following  labs and imaging studies  CBC: Recent Labs  Lab 02/16/19 1325 02/17/19 0640  WBC 10.6* 6.4  HGB 15.4 14.2  HCT 46.9 41.7  MCV 93.6 93.7  PLT 157 292*   Basic Metabolic Panel: Recent Labs  Lab 02/16/19 1325 02/17/19 0640  NA 136 136  K 3.6 3.7  CL 100 104  CO2 25 23  GLUCOSE 108* 98  BUN 18 17  CREATININE 0.91 0.75  CALCIUM 9.2 8.6*   GFR: Estimated Creatinine Clearance: 97.9 mL/min (by C-G formula based on SCr of 0.75 mg/dL). Liver Function Tests: No results for input(s): AST, ALT, ALKPHOS, BILITOT, PROT, ALBUMIN in the last 168 hours. No results for input(s): LIPASE, AMYLASE in the last 168 hours. No results for input(s): AMMONIA in the last 168 hours. Coagulation Profile: No results for input(s): INR, PROTIME in the last 168 hours. Cardiac Enzymes: No results for input(s): CKTOTAL, CKMB, CKMBINDEX, TROPONINI in the last 168 hours. BNP (last 3 results) No results for input(s): PROBNP in the last 8760 hours. HbA1C: No results for input(s): HGBA1C in the last 72 hours. CBG: No results for input(s): GLUCAP in the last 168 hours. Lipid Profile: No results for input(s): CHOL, HDL, LDLCALC, TRIG, CHOLHDL, LDLDIRECT in the last 72 hours. Thyroid Function Tests: No results for input(s): TSH, T4TOTAL, FREET4, T3FREE, THYROIDAB in the last 72 hours. Anemia Panel: No results for input(s): VITAMINB12, FOLATE, FERRITIN, TIBC, IRON, RETICCTPCT in the last 72 hours. Sepsis Labs: Recent Labs  Lab 02/16/19 1325  LATICACIDVEN 1.6    Recent Results (from the past 240 hour(s))  Blood culture (routine x 2)     Status: None (Preliminary result)   Collection Time: 02/16/19  1:18 PM  Result Value Ref Range Status   Specimen Description   Final    BLOOD RIGHT ANTECUBITAL Performed at [facility], [place] [address], [place], [place] [address]    Special Requests   Final    BOTTLES DRAWN AEROBIC AND ANAEROBIC Blood Culture adequate volume Performed at [facility] [address] [place], [place] [address]    Culture   Final    NO GROWTH < 24 HOURS Performed at [facility] [address], [place], [place] [address]    Report Status PENDING  Incomplete  Urine C&S     Status: None   Collection Time: 02/16/19  1:25 PM  Result Value Ref Range Status   Specimen Description   Final    URINE, RANDOM Performed at [facility] [address] [place], [place] [address]    Special Requests   Final    NONE Performed at [facility], [place] [address] [place], [place] [address]    Culture   Final    NO GROWTH Performed at [facility], [place] [address], [place],  [address]    Report Status 02/17/2019 FINAL  Final  Culture, blood (routine x 2)     Status: None (Preliminary result)   Collection Time: 02/16/19  5:09 PM  Result Value Ref Range Status   Specimen Description   Final    RIGHT ANTECUBITAL Performed at [facility],  [place] [address] [place], [place] [address]    Special Requests   Final    BOTTLES DRAWN AEROBIC AND ANAEROBIC Blood Culture results may not be optimal due to an excessive volume of blood received in culture bottles Performed at [facility] [address], [place], [place] [address]    Culture   Final    NO GROWTH < 12 HOURS Performed at [facility] [address], [place], [place] [address]    Report Status PENDING  Incomplete         Radiology Studies: Ct Renal Stone Study  Result Date: 02/16/2019 CLINICAL DATA:  Hematuria, lower back pain EXAM: CT ABDOMEN AND PELVIS WITHOUT CONTRAST TECHNIQUE: Multidetector CT imaging of the abdomen and pelvis was performed following the standard protocol without IV contrast. COMPARISON:  CT abdomen pelvis, 01/31/2018 FINDINGS: Lower chest: No acute abnormality. Mild fibrotic change in the included lung bases, unchanged from prior. Coronary artery calcifications.  Hepatobiliary: No focal liver abnormality is seen. No gallstones, gallbladder wall thickening, or biliary dilatation. Pancreas: Unremarkable. No pancreatic ductal dilatation or surrounding inflammatory changes. Spleen: Normal in size without focal abnormality. Adrenals/Urinary Tract: Adrenal glands are unremarkable. Multiple small nonobstructive calculi of the right kidney. Multiple large bilateral exophytic renal cysts, including an inferior pole cyst of the right kidney measuring at least 11.7 cm. Bladder is unremarkable. Stomach/Bowel: Stomach is within normal limits. No evidence of bowel wall thickening, distention, or inflammatory changes. Sigmoid diverticulosis. Vascular/Lymphatic: Calcific atherosclerosis. No enlarged abdominal or pelvic lymph nodes. Reproductive: No mass or other abnormality. Other: Small fat containing left inguinal hernia. No abdominopelvic ascites. Musculoskeletal: No acute or significant osseous findings. IMPRESSION: 1. Multiple small nonobstructive calculi of the right kidney. No evidence of left-sided urinary tract calculus or hydronephrosis. 2. Multiple large bilateral exophytic renal cysts, including an inferior pole cyst of the right kidney measuring at least 11.7 cm. Very large renal cysts are occasionally symptomatic. 3.  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 4.  Coronary artery disease. Electronically Signed   By: Eddie Candle M.D.   On: 02/16/2019 15:47        Scheduled Meds: . aspirin  81 mg Oral QHS  . clopidogrel  75 mg Oral Daily  . docusate sodium  200 mg Oral QHS  . enoxaparin (LOVENOX) injection  40 mg Subcutaneous Q24H  . isosorbide mononitrate  60 mg Oral QAC breakfast  . lisinopril  10 mg Oral Daily  . metoprolol succinate  100 mg Oral Daily  . rosuvastatin  10 mg Oral QHS  . thiamine  100 mg Oral Daily  . vitamin B-12  500 mcg Oral Daily  . vitamin C  500 mg Oral Daily   Continuous Infusions: . sodium chloride 75 mL/hr at 02/17/19  0950  . cefTRIAXone (ROCEPHIN)  IV 1 g (02/16/19 2110)     LOS: 0 days    Time spent: 35 mins.More than 50% of that time was spent in counseling and/or coordination of care.      Shelly Coss, MD [facility] Pager [phone]  If 7PM-7AM, please contact night-coverage [email or web address] Password [facility] 02/17/2019, 1:44 PM

## 2019-02-18 ENCOUNTER — Ambulatory Visit: Payer: Medicare Other | Admitting: Gastroenterology

## 2019-02-18 MED ORDER — CEFDINIR 300 MG PO CAPS
300.0000 mg | ORAL_CAPSULE | Freq: Two times a day (BID) | ORAL | Status: DC
  Administered 2019-02-18: 14:00:00 300 mg via ORAL
  Filled 2019-02-18: qty 1

## 2019-02-18 MED ORDER — CEFDINIR 300 MG PO CAPS: 300.0000 mg | ORAL_CAPSULE | Freq: Two times a day (BID) | ORAL | 0 refills | Status: AC

## 2019-02-18 NOTE — Discharge Instructions (Signed)
Henry Morris,,  You were in the hospital because of a urinary tract infection. This is being treated with antibiotics. Please follow-up with your primary care physician. You will complete a total of 10 days of antibiotics with 8 days remaining (including today).

## 2019-02-18 NOTE — Progress Notes (Signed)
Henry Morris to be D/C'd Home per MD order.  Discussed prescriptions and follow up appointments with the patient. Prescriptions given to patient, medication list explained in detail. Pt verbalized understanding.  Allergies as of 02/18/2019   No Known Allergies     Medication List    TAKE these medications   aspirin 81 MG chewable tablet Chew 81 mg by mouth at bedtime.   cefdinir 300 MG capsule Commonly known as:  OMNICEF Take 1 capsule (300 mg total) by mouth every 12 (twelve) hours for 8 days.   cetirizine 10 MG tablet Commonly known as:  ZYRTEC Take 10 mg by mouth daily.   clopidogrel 75 MG tablet Commonly known as:  PLAVIX Take 75 mg by mouth daily.   CoQ10 200 MG Caps Take 200 mg by mouth daily.   docusate sodium 100 MG capsule Commonly known as:  COLACE Take 200 mg by mouth at bedtime.   Fish Oil 1200 MG Caps Take 2,400 mg by mouth daily.   isosorbide mononitrate 60 MG 24 hr tablet Commonly known as:  IMDUR Take 60 mg by mouth every morning.   lisinopril 10 MG tablet Commonly known as:  ZESTRIL Take 10 mg by mouth daily.   metoprolol succinate 100 MG 24 hr tablet Commonly known as:  TOPROL-XL Take 100 mg by mouth daily. Take with or immediately following a meal.   nitroGLYCERIN 0.4 MG SL tablet Commonly known as:  NITROSTAT Place 0.4 mg under the tongue every 5 (five) minutes as needed for chest pain.   rosuvastatin 10 MG tablet Commonly known as:  CRESTOR Take 10 mg by mouth at bedtime.   thiamine 100 MG tablet Commonly known as:  VITAMIN B-1 Take 100 mg by mouth daily.   vitamin B-12 500 MCG tablet Commonly known as:  CYANOCOBALAMIN Take 500 mcg by mouth daily.   vitamin C 500 MG tablet Commonly known as:  ASCORBIC ACID Take 500 mg by mouth daily.       Vitals:   02/18/19 0731 02/18/19 1402  BP: 131/90 93/66  Pulse: 70 66  Resp: 18   Temp: 98.2 F (36.8 C) 98.1 F (36.7 C)  SpO2: 97% 96%    Skin clean, dry and intact without  evidence of skin break down, no evidence of skin tears noted. IV catheter discontinued intact. Site without signs and symptoms of complications. Dressing and pressure applied. Pt denies pain at this time. No complaints noted.  An After Visit Summary was printed and given to the patient. Patient escorted via [place], and D/C home via private auto.  Henry Morris 02/18/2019 3:45 PM

## 2019-02-18 NOTE — Discharge Summary (Signed)
Physician Discharge Summary  Eryc Bodey IWP:[id number] DOB: April 08, 1946 DOA: 02/16/2019  PCP: Eber Hong, MD  Admit date: 02/16/2019 Discharge date: 02/18/2019  Admitted From: Home Disposition: Home  Recommendations for Outpatient Follow-up:  1. Follow up with PCP in 1 week 2. Please obtain BMP/CBC in one week 3. Please follow up on the following pending results: Blood cultures  Home Health: None Equipment/Devices: None  Discharge Condition: Stable CODE STATUS: Full code Diet recommendation: Heart healthy   Brief/Interim Summary:  Admission HPI written by Shelly Coss, MD   HPI: Henry Morris is a 73 y.o. male with medical history significant of CAD,HTN,HLD , chronic renal cysts, nephrolithiasis who presented to the emergency department today with complaints of fever, dysuria and hematuria.  Patient said he had a rough week.  Last Friday night he had a very painful urination which he describes as the worst pain in his life.  He also saw blood in the commode in his urine.  He also developed a fever of 101 F.  He went to an urgent care center on Saturday.  Urine culture was taken and he was given a shot of antibiotics and was sent on oral abx.  He continued to have fever on Sunday up to 102.6.  This morning he called his urologist Dr. Alinda Money and he was advised to come to the emergency department . Patient says he has been having issues with his urinary tract . He had kidney stone surgery last year by Dr. Alinda Money.  Since then he had hematuria on and off.  Patient has history of chronic renal cysts. He has history of coronary artery disease and follows with Dr. Jimmye Norman at [facility]. Patient seen and examined at the bedside in ED. currently he is hemodynamically stable.  He was febrile up to 88 F when he presented.  Renal function is intact. Patient denies any chest pain, shortness of breath, palpitations, flank pain, nausea, vomiting, diarrhea or headache.   ED Course:  Urinalysis was suggestive of urinary tract infection.  Started on ceftriaxone.  Urine culture, blood culture taken.  ED staff discussed with urology   Hospital course:  Sepsis Secondary to UTI. Febrile with leukocytosis on admission. Present on admission. Blood/urine cultures negative to date however, outside urgent care records significant for E. Coli on urine culture, sensitive to 3rd generation cephalosporins.   Complicated urinary tract infection E. Coli from outside records. Discharged on 10 day total course of antibiotics.  CAD Hypertension Hyperlipidemia Continue home aspirin, Plavix, metoprolol, Crestor   Discharge Diagnoses:  Active Problems:   UTI (urinary tract infection)    Discharge Instructions  Discharge Instructions    Call MD for:  temperature >100.4   Complete by:  As directed    Diet - low sodium heart healthy   Complete by:  As directed    Increase activity slowly   Complete by:  As directed      Allergies as of 02/18/2019   No Known Allergies     Medication List    TAKE these medications   aspirin 81 MG chewable tablet Chew 81 mg by mouth at bedtime.   cefdinir 300 MG capsule Commonly known as:  OMNICEF Take 1 capsule (300 mg total) by mouth every 12 (twelve) hours for 8 days.   cetirizine 10 MG tablet Commonly known as:  ZYRTEC Take 10 mg by mouth daily.   clopidogrel 75 MG tablet Commonly known as:  PLAVIX Take 75 mg by mouth daily.   CoQ10  200 MG Caps Take 200 mg by mouth daily.   docusate sodium 100 MG capsule Commonly known as:  COLACE Take 200 mg by mouth at bedtime.   Fish Oil 1200 MG Caps Take 2,400 mg by mouth daily.   isosorbide mononitrate 60 MG 24 hr tablet Commonly known as:  IMDUR Take 60 mg by mouth every morning.   lisinopril 10 MG tablet Commonly known as:  ZESTRIL Take 10 mg by mouth daily.   metoprolol succinate 100 MG 24 hr tablet Commonly known as:  TOPROL-XL Take 100 mg by mouth daily. Take with or  immediately following a meal.   nitroGLYCERIN 0.4 MG SL tablet Commonly known as:  NITROSTAT Place 0.4 mg under the tongue every 5 (five) minutes as needed for chest pain.   rosuvastatin 10 MG tablet Commonly known as:  CRESTOR Take 10 mg by mouth at bedtime.   thiamine 100 MG tablet Commonly known as:  VITAMIN B-1 Take 100 mg by mouth daily.   vitamin B-12 500 MCG tablet Commonly known as:  CYANOCOBALAMIN Take 500 mcg by mouth daily.   vitamin C 500 MG tablet Commonly known as:  ASCORBIC ACID Take 500 mg by mouth daily.      Follow-up Information    Eber Hong, MD. Schedule an appointment as soon as possible for a visit in 1 week(s).   Specialty:  Internal Medicine Why:  Hospital follow-up Contact information: [place] [address] [phone]          No Known Allergies  Consultations:  None   Procedures/Studies: Ct Renal Stone Study  Result Date: 02/16/2019 CLINICAL DATA:  Hematuria, lower back pain EXAM: CT ABDOMEN AND PELVIS WITHOUT CONTRAST TECHNIQUE: Multidetector CT imaging of the abdomen and pelvis was performed following the standard protocol without IV contrast. COMPARISON:  CT abdomen pelvis, 01/31/2018 FINDINGS: Lower chest: No acute abnormality. Mild fibrotic change in the included lung bases, unchanged from prior. Coronary artery calcifications. Hepatobiliary: No focal liver abnormality is seen. No gallstones, gallbladder wall thickening, or biliary dilatation. Pancreas: Unremarkable. No pancreatic ductal dilatation or surrounding inflammatory changes. Spleen: Normal in size without focal abnormality. Adrenals/Urinary Tract: Adrenal glands are unremarkable. Multiple small nonobstructive calculi of the right kidney. Multiple large bilateral exophytic renal cysts, including an inferior pole cyst of the right kidney measuring at least 11.7 cm. Bladder is unremarkable. Stomach/Bowel: Stomach is within normal limits. No evidence of bowel  wall thickening, distention, or inflammatory changes. Sigmoid diverticulosis. Vascular/Lymphatic: Calcific atherosclerosis. No enlarged abdominal or pelvic lymph nodes. Reproductive: No mass or other abnormality. Other: Small fat containing left inguinal hernia. No abdominopelvic ascites. Musculoskeletal: No acute or significant osseous findings. IMPRESSION: 1. Multiple small nonobstructive calculi of the right kidney. No evidence of left-sided urinary tract calculus or hydronephrosis. 2. Multiple large bilateral exophytic renal cysts, including an inferior pole cyst of the right kidney measuring at least 11.7 cm. Very large renal cysts are occasionally symptomatic. 3.  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 4.  Coronary artery disease. Electronically Signed   By: Eddie Candle M.D.   On: 02/16/2019 15:47      Subjective: Feels much better today. No dysuria.  Discharge Exam: Vitals:   02/17/19 1407 02/18/19 0731  BP: (!) 93/59 131/90  Pulse: 72 70  Resp: 16 18  Temp: 98.2 F (36.8 C) 98.2 F (36.8 C)  SpO2: 100% 97%   Vitals:   02/17/19 0458 02/17/19 0729 02/17/19 1407 02/18/19 0731  BP: 122/71 133/85 Marland Kitchen)  93/59 131/90  Pulse: 84 86 72 70  Resp: 17 18 16 18   Temp: 98.9 F (37.2 C) 100.2 F (37.9 C) 98.2 F (36.8 C) 98.2 F (36.8 C)  TempSrc:  Oral Oral Oral  SpO2: 97% 96% 100% 97%    General: Pt is alert, awake, not in acute distress Cardiovascular: RRR, S1/S2 +, no rubs, no gallops Respiratory: CTA bilaterally, no wheezing, no rhonchi Abdominal: Soft, NT, ND, bowel sounds + Extremities: no edema, no cyanosis    The results of significant diagnostics from this hospitalization (including imaging, microbiology, ancillary and laboratory) are listed below for reference.     Microbiology: Recent Results (from the past 240 hour(s))  Blood culture (routine x 2)     Status: None (Preliminary result)   Collection Time: 02/16/19  1:18 PM  Result Value Ref Range Status    Specimen Description   Final    BLOOD RIGHT ANTECUBITAL Performed at [facility], [place] [address], [place], [place] [address]    Special Requests   Final    BOTTLES DRAWN AEROBIC AND ANAEROBIC Blood Culture adequate volume Performed at [facility] [address], [place], [place] [address]    Culture   Final    NO GROWTH 2 DAYS Performed at [facility] [address] [place], [place] [address]    Report Status PENDING  Incomplete  Urine C&S     Status: None   Collection Time: 02/16/19  1:25 PM  Result Value Ref Range Status   Specimen Description   Final    URINE, RANDOM Performed at [facility] [address], [place], [place] [address]    Special Requests   Final    NONE Performed at [facility], [place] [address] [place], [place] [address]    Culture   Final    NO GROWTH Performed at [facility], [place] [address] [place], [place] [address]    Report Status 02/17/2019 FINAL  Final  Culture, blood (routine x 2)     Status: None (Preliminary result)   Collection Time: 02/16/19  5:09 PM  Result Value Ref Range Status   Specimen Description   Final    RIGHT ANTECUBITAL Performed at [facility] [address], [place], [place] [address]    Special Requests   Final    BOTTLES DRAWN AEROBIC AND ANAEROBIC Blood Culture results may not be optimal due to an excessive volume of blood received in culture bottles Performed at [facility] [address], [place],  [address]    Culture   Final    NO GROWTH 2 DAYS Performed at [facility] [address], [place],  [address]    Report Status PENDING  Incomplete     Labs: BNP (last 3 results) No results for input(s): BNP in the last 8760 hours. Basic Metabolic Panel: Recent Labs  Lab 02/16/19 1325 02/17/19 0640  NA 136 136  K 3.6 3.7  CL 100 104  CO2 25 23  GLUCOSE 108* 98  BUN  18 17  CREATININE 0.91 0.75  CALCIUM 9.2 8.6*   Liver Function Tests: No results for input(s): AST, ALT, ALKPHOS, BILITOT, PROT, ALBUMIN in the last 168 hours. No results for input(s): LIPASE, AMYLASE in the last 168 hours. No results for input(s): AMMONIA in the last 168 hours. CBC: Recent Labs  Lab 02/16/19 1325 02/17/19 0640  WBC 10.6* 6.4  HGB 15.4 14.2  HCT 46.9 41.7  MCV 93.6 93.7  PLT 157 139*   Cardiac Enzymes: No results for input(s): CKTOTAL, CKMB, CKMBINDEX, TROPONINI in the last 168 hours. BNP: Invalid input(s): POCBNP CBG: No results for input(s): GLUCAP in the last 168 hours. D-Dimer No results for input(s): DDIMER in the last 72 hours. Hgb A1c No results for input(s): HGBA1C in the last 72 hours. Lipid Profile No results for input(s): CHOL, HDL, LDLCALC, TRIG, CHOLHDL, LDLDIRECT in the last 72 hours. Thyroid function studies No results for input(s): TSH, T4TOTAL, T3FREE, THYROIDAB in the last 72 hours.  Invalid input(s): FREET3 Anemia work up No results for input(s): VITAMINB12, FOLATE, FERRITIN, TIBC, IRON, RETICCTPCT in the last 72 hours. Urinalysis    Component Value Date/Time   COLORURINE YELLOW 02/16/2019 1325   APPEARANCEUR HAZY (A) 02/16/2019 1325   LABSPEC 1.018 02/16/2019 1325   PHURINE 5.0 02/16/2019 1325   GLUCOSEU NEGATIVE 02/16/2019 1325   HGBUR NEGATIVE 02/16/2019 1325   BILIRUBINUR NEGATIVE 02/16/2019 1325   KETONESUR 5 (A) 02/16/2019 1325   PROTEINUR 30 (A) 02/16/2019 1325   NITRITE NEGATIVE 02/16/2019 1325   LEUKOCYTESUR SMALL (A) 02/16/2019 1325   Sepsis Labs Invalid input(s): PROCALCITONIN,  WBC,  LACTICIDVEN Microbiology Recent Results (from the past 240 hour(s))  Blood culture (routine x 2)     Status: None (Preliminary result)   Collection Time: 02/16/19  1:18 PM  Result Value Ref Range Status   Specimen Description   Final    BLOOD RIGHT ANTECUBITAL Performed at [facility], [place] [address], [place], [place]  [address]    Special Requests   Final    BOTTLES DRAWN AEROBIC AND ANAEROBIC Blood Culture adequate volume Performed at [facility] [address], [place], [place] [address]    Culture   Final    NO GROWTH 2 DAYS Performed at [facility] [address] [place], [place] [address]    Report Status PENDING  Incomplete  Urine C&S     Status: None   Collection Time: 02/16/19  1:25 PM  Result Value Ref Range Status   Specimen Description   Final    URINE, RANDOM Performed at [facility] [address] [place], [place] [address]    Special Requests   Final    NONE Performed at [facility], [place] [address] [place], [place] [address]    Culture   Final    NO GROWTH Performed at [facility], [place] [address], [place], [place] [address]    Report Status 02/17/2019 FINAL  Final  Culture, blood (routine x 2)     Status: None (Preliminary result)   Collection Time: 02/16/19  5:09 PM  Result Value Ref Range Status   Specimen Description   Final    RIGHT ANTECUBITAL Performed at [facility] [address], [place], [place] [address]    Special Requests   Final    BOTTLES DRAWN AEROBIC AND ANAEROBIC Blood Culture results may not be optimal due to an excessive volume of blood received in culture bottles Performed at [facility] [address] [place], [place] [address]    Culture   Final    NO GROWTH 2 DAYS Performed at [facility] [address] [place], [place] [address]    Report Status PENDING  Incomplete     SIGNED:   Cordelia Poche, MD [facility] 02/18/2019, 1:58  PM

## 2019-02-18 NOTE — TOC Transition Note (Signed)
Transition of Care [facility]) - CM/SW Discharge Note   Patient Details  Name: Henry Morris MRN: [id number] Date of Birth: 04-19-1946  [facility] CM/SW Contact:  Leeroy Cha, RN Phone Number: 02/18/2019, 3:06 PM   Clinical Narrative:    Discharged to home with self-care, orders checked for hhc needs. No TOC needs present at time of discharge.  Patient is able to arrangement own appointments and home care.   Final next level of care: Home/Self Care Barriers to Discharge: No Barriers Identified   Patient Goals and CMS Choice Patient states their goals for this hospitalization and ongoing recovery are:: none CMS Medicare.gov Compare Post Acute Care list provided to:: Patient    Discharge Placement                       Discharge Plan and Services   Discharge Planning Services: CM Consult                                 Social Determinants of Health (SDOH) Interventions     Readmission Risk Interventions No flowsheet data found.

## 2019-02-21 LAB — CULTURE, BLOOD (ROUTINE X 2)
Culture: NO GROWTH
Culture: NO GROWTH
Special Requests: ADEQUATE

## 2019-04-15 NOTE — Addendum Note (Signed)
Addended by: Yetta Flock on: 04/15/2019 02:38 PM   Modules accepted: Level of Service

## 2019-05-14 ENCOUNTER — Telehealth: Payer: Self-pay

## 2019-05-14 NOTE — Telephone Encounter (Signed)
Anti Coag clearance letter sent to Dr. Rosanne Sack at [facility] on 7-20.    7-23 Refaxed letter and called and LM for nurse requesting a response to request

## 2019-05-15 NOTE — Telephone Encounter (Signed)
We have received a fax from Dr Rosanne Sack at [facility] indicating the following:  "RE: Henry Morris ([birth year])  No contraindication for temporary discontinuation of plavix 5 days prior to upcoming scheduled colonoscopy.  -Dr Rosanne Sack"  I have spoken to patient and have advised him that per Dr Jimmye Norman, he may hold his plavix 5 days prior to his colonoscopy and that he will also be told this at his previsit on 05/21/19. Patient verbalizes understanding of this.  See original letter under "media" tab in Epic.

## 2019-05-21 ENCOUNTER — Other Ambulatory Visit: Payer: Self-pay

## 2019-05-21 ENCOUNTER — Ambulatory Visit: Payer: Medicare Other | Admitting: *Deleted

## 2019-05-21 VITALS — Ht 71.0 in | Wt 204.0 lb

## 2019-05-21 DIAGNOSIS — Z86010 Personal history of colonic polyps: Secondary | ICD-10-CM

## 2019-05-21 MED ORDER — SUPREP BOWEL PREP KIT 17.5-3.13-1.6 GM/177ML PO SOLN: 1.0000 | Freq: Once | ORAL | 0 refills | Status: AC

## 2019-05-21 NOTE — Progress Notes (Signed)
Previsit via telephone related to Short 19 pandemic.  ID per name, dob and address  No egg or soy allergy known to patient  Patient stating his blood pressure does drop following anesthesia, anesthesia telling patient to tell anyone giving anesthesia. No airway problems No diet pills per patient No home 02 use per patient  No blood thinners per patient  Pt denies issues with constipation  No A fib or A flutter  EMMI  Information, consent , acknowledgement form with envelope stamped for return, and instructionssent in packet per mail to the patient. Patient aware and knows to call if difficulty obtaining the prep

## 2019-06-01 ENCOUNTER — Telehealth: Payer: Self-pay | Admitting: Gastroenterology

## 2019-06-01 NOTE — Telephone Encounter (Signed)

## 2019-06-02 ENCOUNTER — Ambulatory Visit (AMBULATORY_SURGERY_CENTER): Payer: Medicare Other | Admitting: Gastroenterology

## 2019-06-02 ENCOUNTER — Other Ambulatory Visit: Payer: Self-pay

## 2019-06-02 ENCOUNTER — Encounter: Payer: Self-pay | Admitting: Gastroenterology

## 2019-06-02 VITALS — BP 104/71 | HR 79 | Temp 98.3°F | Resp 18 | Ht 71.0 in | Wt 204.0 lb

## 2019-06-02 DIAGNOSIS — D120 Benign neoplasm of cecum: Secondary | ICD-10-CM | POA: Diagnosis not present

## 2019-06-02 DIAGNOSIS — Z86010 Personal history of colonic polyps: Secondary | ICD-10-CM

## 2019-06-02 MED ORDER — SODIUM CHLORIDE 0.9 % IV SOLN: 500.0000 mL | Freq: Once | INTRAVENOUS | Status: DC

## 2019-06-02 NOTE — Progress Notes (Signed)
Called to room to assist during endoscopic procedure.  Patient ID and intended procedure confirmed with present staff. Received instructions for my participation in the procedure from the performing physician.  

## 2019-06-02 NOTE — Progress Notes (Signed)
Pt's states no medical or surgical changes since previsit or office visit.Pt's states no medical or surgical changes since previsit or office visit.  CW vitals and temps by [place].

## 2019-06-02 NOTE — Patient Instructions (Signed)
Resume Plavix tomorrow ( 06/03/19). Handout on polyps, diverticulosis given   YOU HAD AN ENDOSCOPIC PROCEDURE TODAY AT [facility]:   Refer to the procedure report that was given to you for any specific questions about what was found during the examination.  If the procedure report does not answer your questions, please call your gastroenterologist to clarify.  If you requested that your care partner not be given the details of your procedure findings, then the procedure report has been included in a sealed envelope for you to review at your convenience later.  YOU SHOULD EXPECT: Some feelings of bloating in the abdomen. Passage of more gas than usual.  Walking can help get rid of the air that was put into your GI tract during the procedure and reduce the bloating. If you had a lower endoscopy (such as a colonoscopy or flexible sigmoidoscopy) you may notice spotting of blood in your stool or on the toilet paper. If you underwent a bowel prep for your procedure, you may not have a normal bowel movement for a few days.  Please Note:  You might notice some irritation and congestion in your nose or some drainage.  This is from the oxygen used during your procedure.  There is no need for concern and it should clear up in a day or so.  SYMPTOMS TO REPORT IMMEDIATELY:   Following lower endoscopy (colonoscopy or flexible sigmoidoscopy):  Excessive amounts of blood in the stool  Significant tenderness or worsening of abdominal pains  Swelling of the abdomen that is new, acute  Fever of 100F or higher   For urgent or emergent issues, a gastroenterologist can be reached at any hour by calling [phone].   DIET:  We do recommend a small meal at first, but then you may proceed to your regular diet.  Drink plenty of fluids but you should avoid alcoholic beverages for 24 hours.  ACTIVITY:  You should plan to take it easy for the rest of today and you should NOT DRIVE or use heavy  machinery until tomorrow (because of the sedation medicines used during the test).    FOLLOW UP: Our staff will call the number listed on your records 48-72 hours following your procedure to check on you and address any questions or concerns that you may have regarding the information given to you following your procedure. If we do not reach you, we will leave a message.  We will attempt to reach you two times.  During this call, we will ask if you have developed any symptoms of COVID 19. If you develop any symptoms (ie: fever, flu-like symptoms, shortness of breath, cough etc.) before then, please call [phone].  If you test positive for Covid 19 in the 2 weeks post procedure, please call and report this information to [place].    If any biopsies were taken you will be contacted by phone or by letter within the next 1-3 weeks.  Please call us at [phone] if you have not heard about the biopsies in 3 weeks.    SIGNATURES/CONFIDENTIALITY: You and/or your care partner have signed paperwork which will be entered into your electronic medical record.  These signatures attest to the fact that that the information above on your After Visit Summary has been reviewed and is understood.  Full responsibility of the confidentiality of this discharge information lies with you and/or your care-partner.

## 2019-06-02 NOTE — Progress Notes (Signed)
To PACU, VSS. Report to [email or web address] 

## 2019-06-02 NOTE — Op Note (Signed)
[facility] Patient Name: Henry Morris Procedure Date: 06/02/2019 1:35 PM MRN: [id number] Endoscopist: Remo Lipps P. Havery Moros , MD Age: 73 Referring MD:  Date of Birth: Jan 08, 1946 Gender: Male Account #: [id number] Procedure:                Colonoscopy Indications:              High risk colon cancer surveillance: Personal                            history of colonic polyps Medicines:                Monitored Anesthesia Care Procedure:                Pre-Anesthesia Assessment:                           - Prior to the procedure, a History and Physical                            was performed, and patient medications and                            allergies were reviewed. The patient's tolerance of                            previous anesthesia was also reviewed. The risks                            and benefits of the procedure and the sedation                            options and risks were discussed with the patient.                            All questions were answered, and informed consent                            was obtained. Prior Anticoagulants: The patient has                            taken Plavix (clopidogrel), last dose was 5 days                            prior to procedure. ASA Grade Assessment: III - A                            patient with severe systemic disease. After                            reviewing the risks and benefits, the patient was                            deemed in satisfactory condition to undergo the  procedure.                           After obtaining informed consent, the colonoscope                            was passed under direct vision. Throughout the                            procedure, the patient's blood pressure, pulse, and                            oxygen saturations were monitored continuously. The                            Colonoscope was introduced through the anus and           advanced to the the cecum, identified by                            appendiceal orifice and ileocecal valve. The                            colonoscopy was performed without difficulty. The                            patient tolerated the procedure well. The quality                            of the bowel preparation was good. The ileocecal                            valve, appendiceal orifice, and rectum were                            photographed. Scope In: 1:46:25 PM Scope Out: 2:04:52 PM Scope Withdrawal Time: 0 hours 10 minutes 39 seconds  Total Procedure Duration: 0 hours 18 minutes 27 seconds  Findings:                 The perianal and digital rectal examinations were                            normal.                           A diminutive polyp was found in the cecum. The                            polyp was sessile. The polyp was removed with a                            cold snare. Resection and retrieval were complete.                           Multiple small and large-mouthed diverticula were  found in the entire colon.                           Internal hemorrhoids were found during retroflexion.                           The exam was otherwise without abnormality. Complications:            No immediate complications. Estimated blood loss:                            Minimal. Estimated Blood Loss:     Estimated blood loss was minimal. Impression:               - One diminutive polyp in the cecum, removed with a                            cold snare. Resected and retrieved.                           - Diverticulosis in the entire examined colon.                           - Internal hemorrhoids.                           - The examination was otherwise normal. Recommendation:           - Patient has a contact number available for                            emergencies. The signs and symptoms of potential                            delayed  complications were discussed with the                            patient. Return to normal activities tomorrow.                            Written discharge instructions were provided to the                            patient.                           - Resume previous diet.                           - Continue present medications.                           - Resume Plavix tomorrow                           - Await pathology results. Remo Lipps P. Armbruster, MD 06/02/2019 2:07:48 PM This report has been signed electronically.

## 2019-06-04 ENCOUNTER — Telehealth: Payer: Self-pay

## 2019-06-04 NOTE — Telephone Encounter (Signed)
  Follow up Call-  Call back number 06/02/2019  Post procedure Call Back phone  # [phone]  Permission to leave phone message Yes  Some recent data might be hidden     Patient questions:  Do you have a fever, pain , or abdominal swelling? No. Pain Score  0 *  Have you tolerated food without any problems? Yes.    Have you been able to return to your normal activities? Yes.    Do you have any questions about your discharge instructions: Diet   No. Medications  No. Follow up visit  No.  Do you have questions or concerns about your Care? No.  Actions: * If pain score is 4 or above: No action needed, pain <4.  1. Have you developed a fever since your procedure? no  2.   Have you had an respiratory symptoms (SOB or cough) since your procedure? no  3.   Have you tested positive for COVID 19 since your procedure no  4.   Have you had any family members/close contacts diagnosed with the COVID 19 since your procedure?  no   If yes to any of these questions please route to Joylene John, RN and Alphonsa Gin, Therapist, sports.

## 2019-06-09 ENCOUNTER — Telehealth: Payer: Self-pay

## 2019-06-09 NOTE — Telephone Encounter (Signed)
Called patient and read him the letter Dr. Havery Moros had sent him as a result note from his colonoscopy. It was sent by My Chart, but patient couldn't pull it up.

## 2020-08-01 IMAGING — CT CT RENAL STONE PROTOCOL
2 of 4 series · 16 of 46 positions shown, 18 images · non-contrast
Comparison: CT abdomen pelvis, 01/31/2018

CLINICAL DATA: Hematuria, lower back pain

EXAM:
CT ABDOMEN AND PELVIS WITHOUT CONTRAST
TECHNIQUE: Multidetector CT imaging of the abdomen and pelvis was performed
following the standard protocol without IV contrast.

[Series 2: axial st · axial · 0.91mm/px · z∈[-629,-184]mm · 13 of 103 slices shown, 15 images]
[im 7/103  soft-tissue]
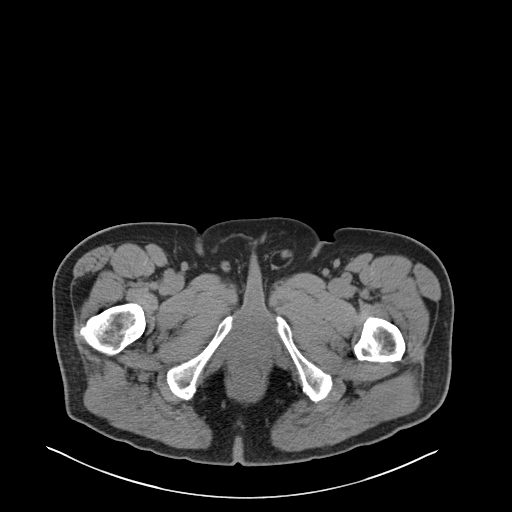
[im 7/103  bone]
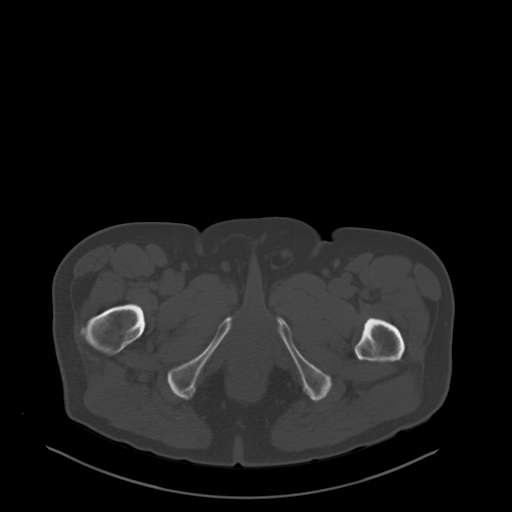
[im 13/103  soft-tissue]
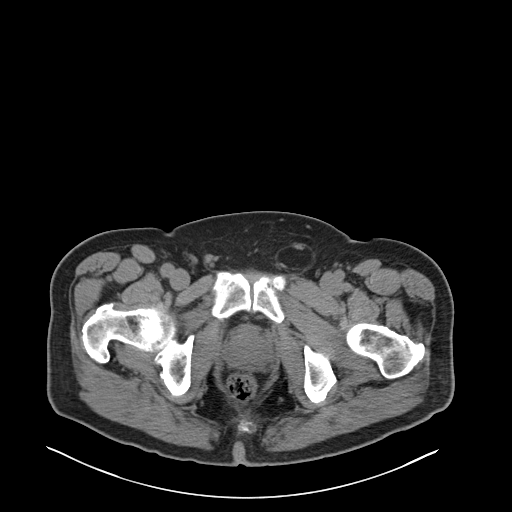
[im 20/103  soft-tissue]
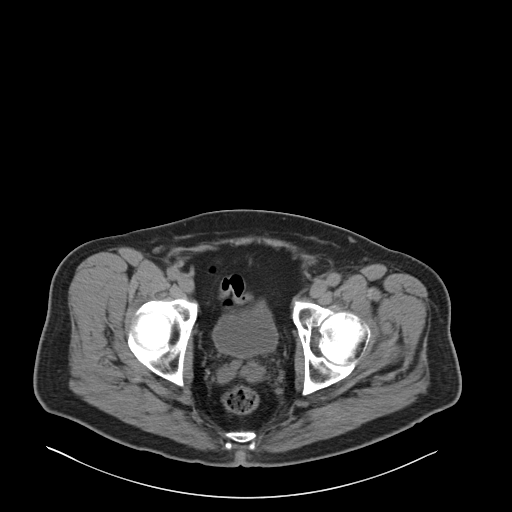
[im 32/103  soft-tissue]
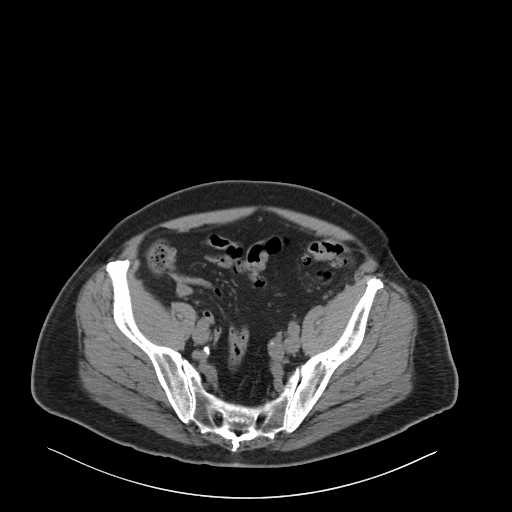
[im 39/103  soft-tissue]
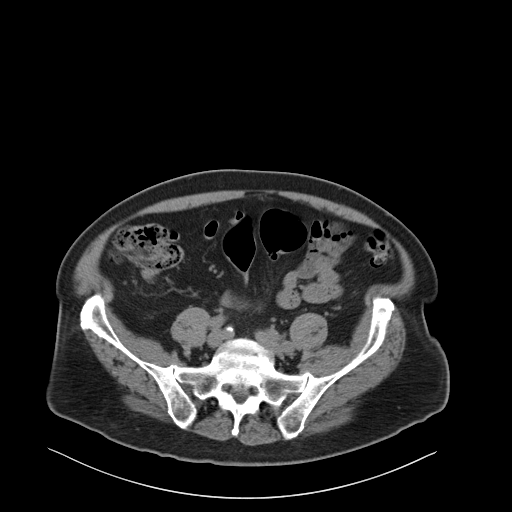
[im 45/103  soft-tissue]
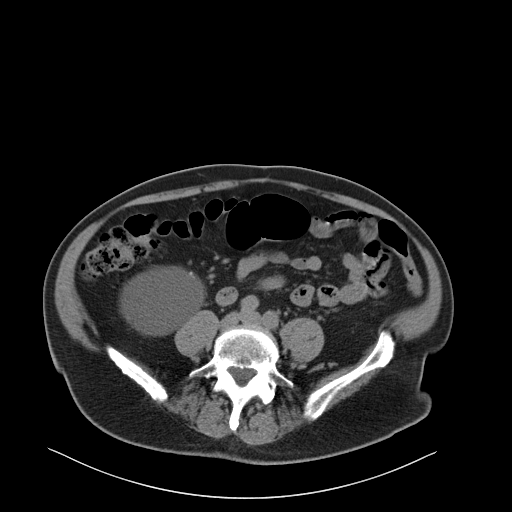
[im 52/103  soft-tissue]
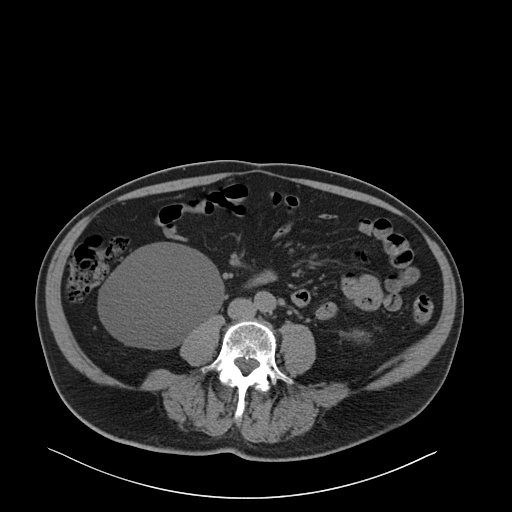
[im 58/103  soft-tissue]
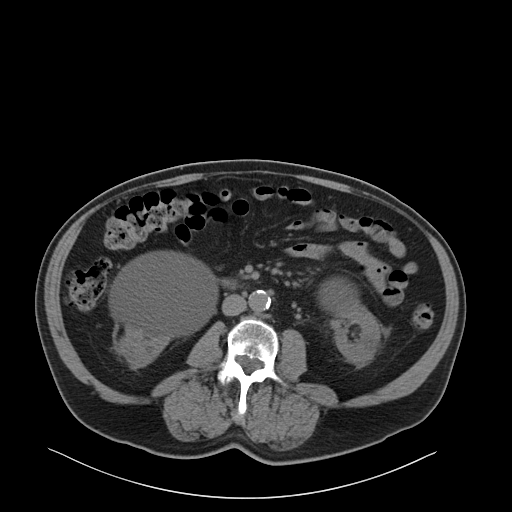
[im 64/103  soft-tissue]
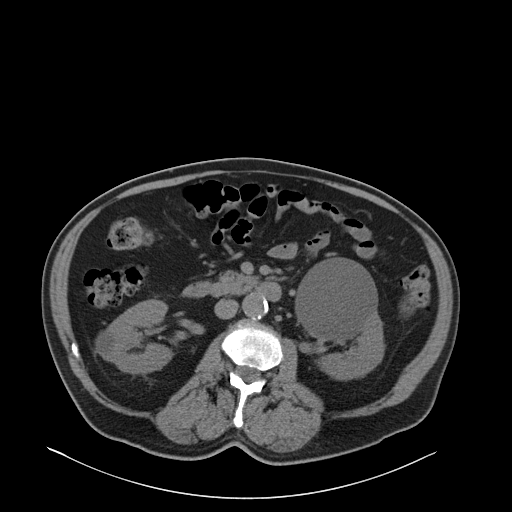
[im 64/103  bone]
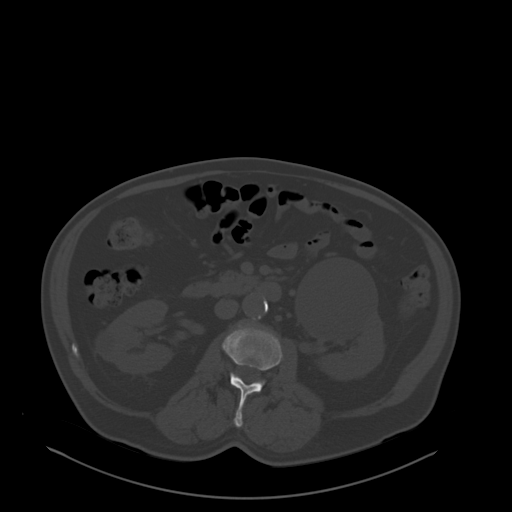
[im 71/103  soft-tissue]
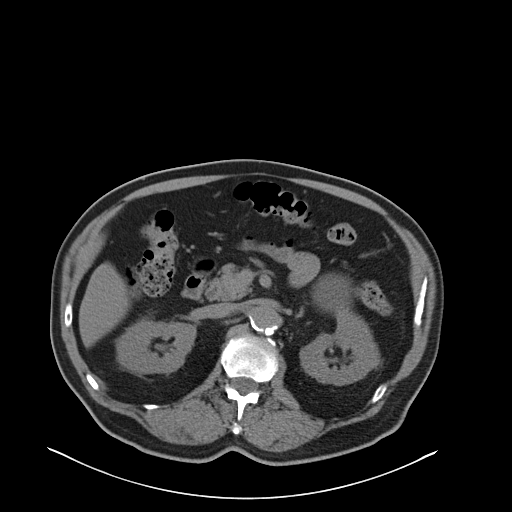
[im 83/103  soft-tissue]
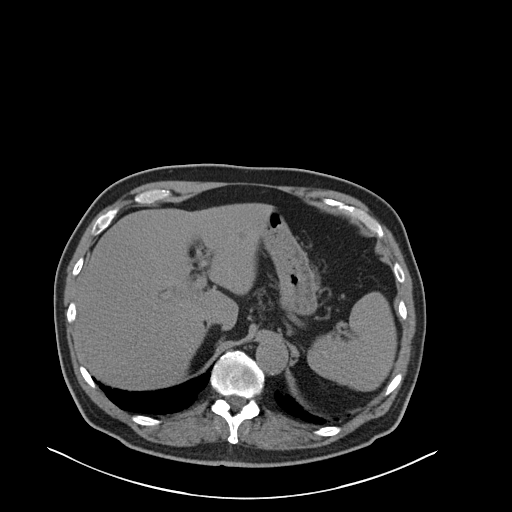
[im 90/103  soft-tissue]
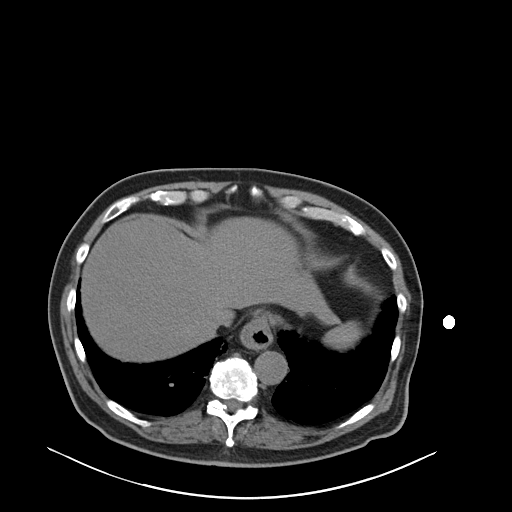
[im 96/103  soft-tissue]
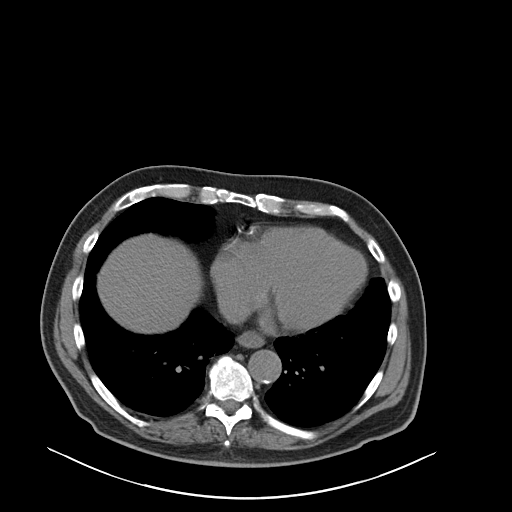

[Series 4: coronal · coronal · 0.86mm/px · 3 of 138 slices shown]
[im 46/138  soft-tissue]
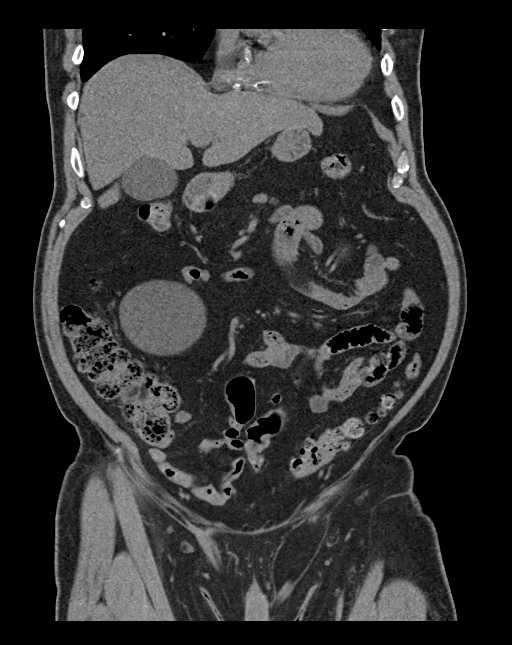
[im 61/138  soft-tissue]
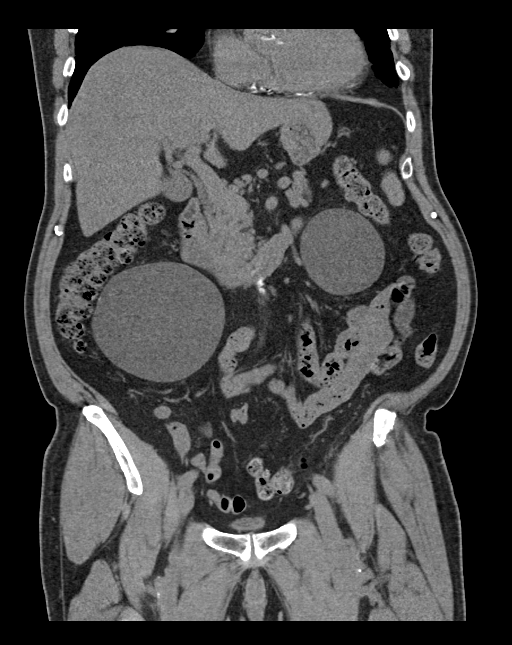
[im 77/138  soft-tissue]
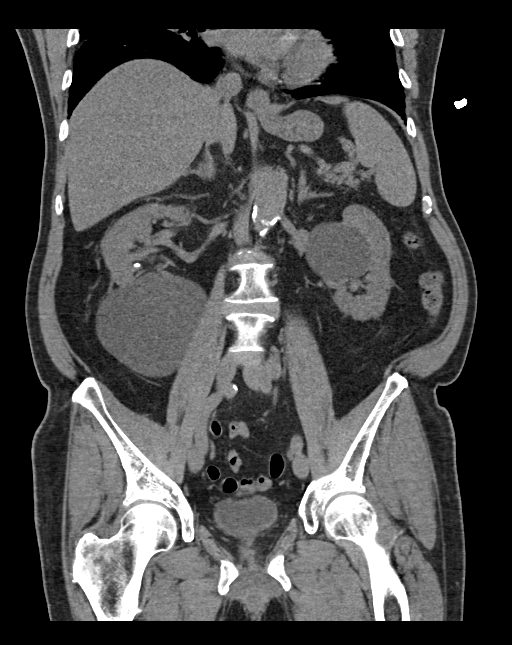

[16 of 46 positions shown; findings below may reference images not displayed]

FINDINGS: Lower chest: No acute abnormality. Mild fibrotic change in the
included lung bases, unchanged from prior. Coronary artery
calcifications.

Hepatobiliary: No focal liver abnormality is seen. No gallstones,
gallbladder wall thickening, or biliary dilatation.

Pancreas: Unremarkable. No pancreatic ductal dilatation or
surrounding inflammatory changes.

Spleen: Normal in size without focal abnormality.

Adrenals/Urinary Tract: Adrenal glands are unremarkable. Multiple
small nonobstructive calculi of the right kidney. Multiple large
bilateral exophytic renal cysts, including an inferior pole cyst of
the right kidney measuring at least 11.7 cm. Bladder is
unremarkable.

Stomach/Bowel: Stomach is within normal limits. No evidence of bowel
wall thickening, distention, or inflammatory changes. Sigmoid
diverticulosis.

Vascular/Lymphatic: Calcific atherosclerosis. No enlarged abdominal
or pelvic lymph nodes.

Reproductive: No mass or other abnormality.

Other: Small fat containing left inguinal hernia. No abdominopelvic
ascites.

Musculoskeletal: No acute or significant osseous findings.
IMPRESSION: 1. Multiple small nonobstructive calculi of the right kidney. No
evidence of left-sided urinary tract calculus or hydronephrosis.

2. Multiple large bilateral exophytic renal cysts, including an
inferior pole cyst of the right kidney measuring at least 11.7 cm.
Very large renal cysts are occasionally symptomatic.

3.  Mild fibrosis of the included bilateral lung bases.

4.  Coronary artery disease.

## 2022-05-15 ENCOUNTER — Ambulatory Visit (INDEPENDENT_AMBULATORY_CARE_PROVIDER_SITE_OTHER): Payer: Medicare Other | Admitting: Gastroenterology

## 2022-05-15 ENCOUNTER — Encounter: Payer: Self-pay | Admitting: Gastroenterology

## 2022-05-15 VITALS — BP 120/80 | HR 83 | Ht 70.0 in | Wt 197.0 lb

## 2022-05-15 DIAGNOSIS — Z86010 Personal history of colonic polyps: Secondary | ICD-10-CM

## 2022-05-15 DIAGNOSIS — Z7902 Long term (current) use of antithrombotics/antiplatelets: Secondary | ICD-10-CM | POA: Diagnosis not present

## 2022-05-15 NOTE — Patient Instructions (Signed)
If you are age 76 or older, your body mass index should be between 23-30. Your Body mass index is 28.27 kg/m. If this is out of the aforementioned range listed, please consider follow up with your Primary Care Provider.  If you are age 31 or younger, your body mass index should be between 19-25. Your Body mass index is 28.27 kg/m. If this is out of the aformentioned range listed, please consider follow up with your Primary Care Provider.   ________________________________________________________  The [facility] providers would like to encourage you to use [facility] to communicate with providers for non-urgent requests or questions.  Due to long hold times on the telephone, sending your provider a message by [facility] may be a faster and more efficient way to get a response.  Please allow 48 business hours for a response.  Please remember that this is for non-urgent requests.  _______________________________________________________  Dennis Bast will be due for a recall colonoscopy in 05-2026. We will send you a reminder in the mail when it gets closer to that time.  Thank you for entrusting me with your care and for choosing [facility], [address] [place]

## 2022-05-15 NOTE — Progress Notes (Signed)
HPI :  76 year old male here for follow-up visit to discuss timing of his next colonoscopy.  He was last seen here in August 2020 for his last surveillance colonoscopy.  Results as outlined below.  He had a diminutive adenoma in the cecum removed with diverticulosis, no high risk lesions.  I recommended consideration for repeat colonoscopy in 7 years versus stopping further surveillance given his age.  He states he was given some information after his colonoscopy and thought he was under the impression he would have a colonoscopy in 3 to 5 years.  He denies any problems with his bowels.  He takes a magnesium supplement daily which prevents constipation.  No blood in his stools.  No family history of colon cancer.  He does have a history of coronary artery disease with occasional angina which is mild at times.  He is taking Plavix daily for history of CAD, also on nitrates.  He feels well without any exertional symptoms and generally doing okay.  He is not sure if he wants to stop further colon polyp surveillance.    Colonoscopy 06/13/12 - extensive diverticulosis, normal colon otherwise no polyps - GOOD bowel preparation   He was seen by his cardiologist in December 2019, no new changes were made. Last echo shows EF of 65% in 09/2015.    Echocardiogram done 09/2015 - EF 65-70%  Colonoscopy 06/02/2019: The perianal and digital rectal examinations were normal. - A diminutive polyp was found in the cecum. The polyp was sessile. The polyp was removed with a cold snare. Resection and retrieval were complete. - Multiple small and large-mouthed diverticula were found in the entire colon. - Internal hemorrhoids were found during retroflexion. - The exam was otherwise without abnormality.  Surgical [P], cecum, polyp - TUBULAR ADENOMA(S). - HIGH GRADE DYSPLASIA IS NOT IDENTIFIED    Past Medical History:  Diagnosis Date   Angina, class II (HCC)    Arthritis of neck    Benign fasciculations     Blood in urine    CAD (coronary artery disease)    Cataract    Cervical disc disease    Complication of anesthesia    blood pressure may drop after anesthesia given (colonoscopy)   Diverticula of colon    Dyslipidemia    Encounter for long-term (current) use of medications    GERD without esophagitis    Heart murmur    History of colon polyps    History of kidney stones    HTN (hypertension)    Hyperglycemia    Hyperlipemia, mixed    Neck pain    Special screening for malignant neoplasm of prostate      Past Surgical History:  Procedure Laterality Date   APPENDECTOMY     CARDIAC CATHETERIZATION     CATARACT EXTRACTION, BILATERAL     COLONOSCOPY     CYSTOSCOPY/URETEROSCOPY/HOLMIUM LASER/STENT PLACEMENT Left 01/30/2018   Procedure: CYSTOSCOPY/URETEROSCOPY/HOLMIUM LASER/STONE EXTRACTION;  Surgeon: Raynelle Bring, MD;  Location: [facility];  Service: Urology;  Laterality: Left;  ONLY NEEDS 30 MIN FOR TOTAL PROCEDURE   INGUINAL HERNIA REPAIR     UMBILICAL HERNIA REPAIR     VASECTOMY     Family History  Problem Relation Age of Onset   Breast cancer Mother    Heart disease Brother    Colon cancer Neg Hx    Esophageal cancer Neg Hx    Stomach cancer Neg Hx    Social History   Tobacco Use   Smoking status: Never  Smokeless tobacco: Never  Vaping Use   Vaping Use: Never used  Substance Use Topics   Alcohol use: No   Drug use: No   Current Outpatient Medications  Medication Sig Dispense Refill   cetirizine (ZYRTEC) 10 MG tablet Take 10 mg by mouth daily.     clopidogrel (PLAVIX) 75 MG tablet Take 75 mg by mouth daily.     Coenzyme Q10 (COQ10) 200 MG CAPS Take 200 mg by mouth daily.      isosorbide mononitrate (IMDUR) 60 MG 24 hr tablet Take 60 mg by mouth every morning.     lisinopril (PRINIVIL,ZESTRIL) 10 MG tablet Take 10 mg by mouth daily.      magnesium oxide (MAG-OX) 400 (240 Mg) MG tablet Take 400 mg by mouth daily.     metoprolol succinate (TOPROL-XL) 100 MG 24  hr tablet Take 100 mg by mouth daily. Take with or immediately following a meal.     mometasone (ELOCON) 0.1 % lotion APPLY 3 DROPS IN AFFECTED EAR(S) THREE TIMES DAILY FOR 2 4 DAYS UNTIL ITCHING IS RESOLVED.     nitroGLYCERIN (NITROSTAT) 0.4 MG SL tablet Place 0.4 mg under the tongue every 5 (five) minutes as needed for chest pain.     Omega-3 Fatty Acids (FISH OIL) 1200 MG CAPS Take 2,400 mg by mouth daily.      rosuvastatin (CRESTOR) 10 MG tablet Take 10 mg by mouth at bedtime.     thiamine (VITAMIN B-1) 100 MG tablet Take 100 mg by mouth daily.     vitamin B-12 (CYANOCOBALAMIN) 500 MCG tablet Take 500 mcg by mouth daily.     vitamin C (ASCORBIC ACID) 500 MG tablet Take 500 mg by mouth daily.      VITAMIN D PO Take by mouth.     No current facility-administered medications for this visit.   No Known Allergies   Review of Systems: All systems reviewed and negative except where noted in HPI.   Labs reviewed in epic  Physical Exam: BP 120/80   Pulse 83   Ht '5\' 10"'$  (1.778 m)   Wt 197 lb (89.4 kg)   BMI 28.27 kg/m  Constitutional: Pleasant,well-developed, male in no acute distress. Neurological: Alert and oriented to person place and time. Psychiatric: Normal mood and affect. Behavior is normal.   ASSESSMENT AND PLAN: 76 year old male here for reassessment of the following:  History of colon polyps Antiplatelet use  As above, the patient was under the impression he may need a colonoscopy at this time.  I reviewed his last colonoscopy exam with him, diminutive adenoma removed about 3 years ago without any other concerning lesions and a good prep.  Based on national guidelines he would not need another colonoscopy for 7 to 10 years.  At earliest he would be due in 7 years, 05/2026, which would bring him up to age 86.  I counseled him that many patients stop having routine surveillance at that age, given the risks can often outweigh the benefits.  With his history of CAD it would  not be unreasonable to stop further surveillance. However, after discussion about this, if he is otherwise feeling well and his other medical problems controlled, his preference would be to have 1 more exam prior to stopping.  He can see me in a few years to see how he is feeling about this at that time to determine if he wants to have any more exams.  All questions answered.  Jolly Mango, MD [facility]

## 2024-04-27 ENCOUNTER — Ambulatory Visit: Admitting: Gastroenterology

## 2024-05-21 ENCOUNTER — Encounter: Payer: Self-pay | Admitting: Gastroenterology

## 2024-05-21 ENCOUNTER — Ambulatory Visit (INDEPENDENT_AMBULATORY_CARE_PROVIDER_SITE_OTHER): Admitting: Gastroenterology

## 2024-05-21 VITALS — BP 104/68 | HR 84 | Ht 70.0 in | Wt 198.0 lb

## 2024-05-21 DIAGNOSIS — Z09 Encounter for follow-up examination after completed treatment for conditions other than malignant neoplasm: Secondary | ICD-10-CM

## 2024-05-21 DIAGNOSIS — Z860100 Personal history of colon polyps, unspecified: Secondary | ICD-10-CM

## 2024-05-21 DIAGNOSIS — Z7902 Long term (current) use of antithrombotics/antiplatelets: Secondary | ICD-10-CM

## 2024-05-21 NOTE — Progress Notes (Signed)
 HPI :  78 year old male here for follow-up visit to discuss history of colon polyps and timing of his next colonoscopy.  Recall I did his last colonoscopy in August 2020.  He had a diminutive adenoma removed and diverticulosis, no high risk lesions. I recommended consideration for repeat colonoscopy in 7 years versus stopping further surveillance given his age.   He questions when he is due for his next colonoscopy.  He is generally doing pretty well without any bowel symptoms that bother him.  He takes magnesium oxide daily for his bowels and he states it works quite well, no constipation.  No abdominal pains.  No blood in his stools.  No family history of colon cancer.  He has a history of coronary artery disease, on Plavix  and also on nitrates daily.  He denies any exertional chest pain or shortness of breath.  He just saw his cardiologist in March who recommended no changes.  He feels really well from a cardiopulmonary standpoint.  He walks 30 minutes every morning.  He has not needed to use his nitroglycerin , no angina.  He is not ready to stop colonoscopy surveillance, questions when is the best time to do his next exam as he is feeling quite well right now and worried about waiting too long to do it.   Prior workup:  Colonoscopy 06/13/12 - extensive diverticulosis, normal colon otherwise no polyps - GOOD bowel preparation    Echocardiogram done 09/2015 - EF 65-70%   Colonoscopy 06/02/2019: The perianal and digital rectal examinations were normal. - A diminutive polyp was found in the cecum. The polyp was sessile. The polyp was removed with a cold snare. Resection and retrieval were complete. - Multiple small and large-mouthed diverticula were found in the entire colon. - Internal hemorrhoids were found during retroflexion. - The exam was otherwise without abnormality.   Surgical [P], cecum, polyp - TUBULAR ADENOMA(S). - HIGH GRADE DYSPLASIA IS NOT IDENTIFIED  Past Medical  History:  Diagnosis Date   Angina, class II (HCC)    Arthritis of neck    Benign fasciculations    Blood in urine    CAD (coronary artery disease)    Cataract    Cervical disc disease    Complication of anesthesia    blood pressure may drop after anesthesia given (colonoscopy)   Diverticula of colon    Dyslipidemia    Encounter for long-term (current) use of medications    GERD without esophagitis    Heart murmur    History of colon polyps    History of kidney stones    HTN (hypertension)    Hyperglycemia    Hyperlipemia, mixed    Neck pain    Special screening for malignant neoplasm of prostate      Past Surgical History:  Procedure Laterality Date   APPENDECTOMY     CARDIAC CATHETERIZATION     CATARACT EXTRACTION, BILATERAL     COLONOSCOPY     CYSTOSCOPY/URETEROSCOPY/HOLMIUM LASER/STENT PLACEMENT Left 01/30/2018   Procedure: CYSTOSCOPY/URETEROSCOPY/HOLMIUM LASER/STONE EXTRACTION;  Surgeon: Renda Glance, MD;  Location: [facility];  Service: Urology;  Laterality: Left;  ONLY NEEDS 30 MIN FOR TOTAL PROCEDURE   INGUINAL HERNIA REPAIR     UMBILICAL HERNIA REPAIR     VASECTOMY     Family History  Problem Relation Age of Onset   Breast cancer Mother    Heart disease Brother    Colon cancer Neg Hx    Esophageal cancer Neg Hx    Stomach  cancer Neg Hx    Social History   Tobacco Use   Smoking status: Never   Smokeless tobacco: Never  Vaping Use   Vaping status: Never Used  Substance Use Topics   Alcohol use: No   Drug use: No   Current Outpatient Medications  Medication Sig Dispense Refill   cetirizine (ZYRTEC) 10 MG tablet Take 10 mg by mouth daily.     clopidogrel  (PLAVIX ) 75 MG tablet Take 75 mg by mouth daily.     Coenzyme Q10 (COQ10) 200 MG CAPS Take 200 mg by mouth daily.      isosorbide  mononitrate (IMDUR ) 60 MG 24 hr tablet Take 60 mg by mouth every morning.     lisinopril  (PRINIVIL ,ZESTRIL ) 10 MG tablet Take 10 mg by mouth daily.      magnesium oxide  (MAG-OX) 400 (240 Mg) MG tablet Take 400 mg by mouth daily.     metoprolol  succinate (TOPROL -XL) 100 MG 24 hr tablet Take 100 mg by mouth daily. Take with or immediately following a meal.     mometasone (ELOCON) 0.1 % lotion APPLY 3 DROPS IN AFFECTED EAR(S) THREE TIMES DAILY FOR 2 4 DAYS UNTIL ITCHING IS RESOLVED.     nitroGLYCERIN  (NITROSTAT ) 0.4 MG SL tablet Place 0.4 mg under the tongue every 5 (five) minutes as needed for chest pain.     Omega-3 Fatty Acids (FISH OIL) 1200 MG CAPS Take 2,400 mg by mouth daily.      rosuvastatin  (CRESTOR ) 10 MG tablet Take 10 mg by mouth at bedtime.     thiamine  (VITAMIN B-1) 100 MG tablet Take 100 mg by mouth daily.     Vibegron (GEMTESA) 75 MG TABS Take by mouth.     vitamin B-12 (CYANOCOBALAMIN ) 500 MCG tablet Take 500 mcg by mouth daily.     vitamin C  (ASCORBIC ACID ) 500 MG tablet Take 500 mg by mouth daily.      VITAMIN D PO Take by mouth.     No current facility-administered medications for this visit.   No Known Allergies   Review of Systems: All systems reviewed and negative except where noted in HPI.   Lab Results  Component Value Date   WBC 6.4 02/17/2019   HGB 14.2 02/17/2019   HCT 41.7 02/17/2019   MCV 93.7 02/17/2019   PLT 139 (L) 02/17/2019   Labs also reviewed in care everywhere  Physical Exam: BP 104/68   Pulse 84   Ht 5' 10 (1.778 m)   Wt 198 lb (89.8 kg)   BMI 28.41 kg/m  Constitutional: Pleasant,well-developed, male in no acute distress. Neurological: Alert and oriented to person place and time. Psychiatric: Normal mood and affect. Behavior is normal.   ASSESSMENT & PLAN: 78 y.o. male here for assessment of the following  1. History of colon polyps   2. Antiplatelet or antithrombotic long-term use    Reviewed his prior colonoscopies with him.  He did have 1 small adenoma removed about 5 years ago.  Updated national surveillance guidelines would recommend a repeat colonoscopy in 7 years from his last exam, thus  2027.  At that time he will be 78 years old.  We discussed given his age and comorbidities if he wanted to pursue any further colonoscopies, as some patients stop having surveillance after age 62.  He definitely wants to have another exam before he stops further surveillance, he is not comfortable with foregoing further exams right now.  He questions when he should do this as he  is currently feeling well without any medical problems and would tolerate anesthesia well, and wonders if he should do it now.  We discussed that he had no high risk lesions on his last exam and I think okay to wait 7 years, his concern is that he may not tolerate the colonoscopy as well at that point in his life pending on his health.  I offered him the exam anywhere from now until then based on when he is most comfortable to do it.  He thinks he may plan on doing it sometime next year, he will call us  to schedule when he wishes to proceed with it.  Again assuming no high risk lesions I think his next exam will be his last one.  He will need approval to hold Plavix  for 5 days prior to the exam, from his cardiologist.  All questions answered he agrees with the plan.  I think he can be directly booked at the [facility] for colonoscopy as long as he has no new cardiopulmonary symptoms and his cardiologist gives approval to hold Plavix  for 5 days prior to the exam.  Marcey Naval, MD [facility]astroenterology
